# Patient Record
Sex: Female | Born: 1985 | Race: White | Hispanic: No | Marital: Married | Smoking: Never smoker
Health system: Southern US, Community
[De-identification: ages and names within clinical notes are randomized; demographics above are authoritative.]

## PROBLEM LIST (undated history)

## (undated) DIAGNOSIS — O009 Unspecified ectopic pregnancy without intrauterine pregnancy: Secondary | ICD-10-CM

## (undated) HISTORY — PX: TONSILECTOMY/ADENOIDECTOMY WITH MYRINGOTOMY: SHX6125

## (undated) HISTORY — PX: APPENDECTOMY: SHX54

---

## 2012-12-07 ENCOUNTER — Encounter: Payer: Self-pay | Admitting: Emergency Medicine

## 2012-12-07 ENCOUNTER — Ambulatory Visit (INDEPENDENT_AMBULATORY_CARE_PROVIDER_SITE_OTHER): Payer: PRIVATE HEALTH INSURANCE | Admitting: Sports Medicine

## 2012-12-07 ENCOUNTER — Emergency Department
Admission: EM | Admit: 2012-12-07 | Discharge: 2012-12-07 | Disposition: A | Discharge status: Home / Self Care | Payer: PRIVATE HEALTH INSURANCE | Source: Home / Self Care | Attending: Emergency Medicine | Admitting: Emergency Medicine

## 2012-12-07 DIAGNOSIS — M109 Gout, unspecified: Secondary | ICD-10-CM

## 2012-12-07 DIAGNOSIS — M1120 Other chondrocalcinosis, unspecified site: Secondary | ICD-10-CM | POA: Insufficient documentation

## 2012-12-07 HISTORY — DX: Unspecified ectopic pregnancy without intrauterine pregnancy: O00.90

## 2012-12-07 NOTE — ED Notes (Signed)
Reports waking up 2 days ago with swelling and pain in right foot; more intense with weight bearing; no known injury. PATIENT IS [redacted] WEEKS PREGNANT WITH EDC 03-18-2013. Took Tylenol twice today.

## 2012-12-07 NOTE — Assessment & Plan Note (Addendum)
[redacted] weeks pregnant. With initial presentation as podagra, aspirated, and injected. Fluid be sent off for crystal analysis. I like to see her back in one month.  Synovial fluid analysis showed calcium pyrophosphate crystals surprisingly confirming a diagnosis of pseudogout. Plan does not change however she will not be a candidate for uric acid lowering medications.

## 2012-12-07 NOTE — Patient Instructions (Signed)
Gout  Gout is an inflammatory condition (arthritis) caused by a buildup of uric acid crystals in the joints. Uric acid is a chemical that is normally present in the blood. Under some circumstances, uric acid can form into crystals in your joints. This causes joint redness, soreness, and swelling (inflammation). Repeat attacks are common. Over time, uric acid crystals can form into masses (tophi) near a joint, causing disfigurement. Gout is treatable and often preventable.  CAUSES   The disease begins with elevated levels of uric acid in the blood. Uric acid is produced by your body when it breaks down a naturally found substance called purines. This also happens when you eat certain foods such as meats and fish. Causes of an elevated uric acid level include:   Being passed down from parent to child (heredity).   Diseases that cause increased uric acid production (obesity, psoriasis, some cancers).   Excessive alcohol use.   Diet, especially diets rich in meat and seafood.   Medicines, including certain cancer-fighting drugs (chemotherapy), diuretics, and aspirin.   Chronic kidney disease. The kidneys are no longer able to remove uric acid well.   Problems with metabolism.  Conditions strongly associated with gout include:   Obesity.   High blood pressure.   High cholesterol.   Diabetes.  Not everyone with elevated uric acid levels gets gout. It is not understood why some people get gout and others do not. Surgery, joint injury, and eating too much of certain foods are some of the factors that can lead to gout.  SYMPTOMS    An attack of gout comes on quickly. It causes intense pain with redness, swelling, and warmth in a joint.   Fever can occur.   Often, only one joint is involved. Certain joints are more commonly involved:   Base of the big toe.   Knee.   Ankle.   Wrist.   Finger.  Without treatment, an attack usually goes away in a few days to weeks. Between attacks, you usually will not have  symptoms, which is different from many other forms of arthritis.  DIAGNOSIS   Your caregiver will suspect gout based on your symptoms and exam. Removal of fluid from the joint (arthrocentesis) is done to check for uric acid crystals. Your caregiver will give you a medicine that numbs the area (local anesthetic) and use a needle to remove joint fluid for exam. Gout is confirmed when uric acid crystals are seen in joint fluid, using a special microscope. Sometimes, blood, urine, and X-ray tests are also used.  TREATMENT   There are 2 phases to gout treatment: treating the sudden onset (acute) attack and preventing attacks (prophylaxis).  Treatment of an Acute Attack   Medicines are used. These include anti-inflammatory medicines or steroid medicines.   An injection of steroid medicine into the affected joint is sometimes necessary.   The painful joint is rested. Movement can worsen the arthritis.   You may use warm or cold treatments on painful joints, depending which works best for you.   Discuss the use of coffee, vitamin C, or cherries with your caregiver. These may be helpful treatment options.  Treatment to Prevent Attacks  After the acute attack subsides, your caregiver may advise prophylactic medicine. These medicines either help your kidneys eliminate uric acid from your body or decrease your uric acid production. You may need to stay on these medicines for a very long time.  The early phase of treatment with prophylactic medicine can be associated   with an increase in acute gout attacks. For this reason, during the first few months of treatment, your caregiver may also advise you to take medicines usually used for acute gout treatment. Be sure you understand your caregiver's directions.  You should also discuss dietary treatment with your caregiver. Certain foods such as meats and fish can increase uric acid levels. Other foods such as dairy can decrease levels. Your caregiver can give you a list of foods  to avoid.  HOME CARE INSTRUCTIONS    Do not take aspirin to relieve pain. This raises uric acid levels.   Only take over-the-counter or prescription medicines for pain, discomfort, or fever as directed by your caregiver.   Rest the joint as much as possible. When in bed, keep sheets and blankets off painful areas.   Keep the affected joint raised (elevated).   Use crutches if the painful joint is in your leg.   Drink enough water and fluids to keep your urine clear or pale yellow. This helps your body get rid of uric acid. Do not drink alcoholic beverages. They slow the passage of uric acid.   Follow your caregiver's dietary instructions. Pay careful attention to the amount of protein you eat. Your daily diet should emphasize fruits, vegetables, whole grains, and fat-free or low-fat milk products.   Maintain a healthy body weight.  SEEK MEDICAL CARE IF:    You have an oral temperature above 102 F (38.9 C).   You develop diarrhea, vomiting, or any side effects from medicines.   You do not feel better in 24 hours, or you are getting worse.  SEEK IMMEDIATE MEDICAL CARE IF:    Your joint becomes suddenly more tender and you have:   Chills.   An oral temperature above 102 F (38.9 C), not controlled by medicine.  MAKE SURE YOU:    Understand these instructions.   Will watch your condition.   Will get help right away if you are not doing well or get worse.  Document Released: 02/06/2000 Document Revised: 05/03/2011 Document Reviewed: 05/19/2009  ExitCare Patient Information 2014 [employer].

## 2012-12-07 NOTE — Progress Notes (Signed)
   Subjective:    I'm seeing this patient as a consultation for:  Dr. 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 Unfortunately over the past day she noted increasing pain, swelling, and redness of her right first metatarsophalangeal joint. She has no history or family history of gout, denies any constitutional symptoms. Pain is localized, severe, persistent. No radiation.  Past medical history, Surgical history, Family history not pertinant except as noted below, Social history, Allergies, and medications have been entered into the medical record, reviewed, and no changes needed.   Review of Systems: No headache, visual changes, nausea, vomiting, diarrhea, constipation, dizziness, abdominal pain, skin rash, fevers, chills, night sweats, weight loss, swollen lymph nodes, body aches, joint swelling, muscle aches, chest pain, shortness of breath, mood changes, visual or auditory hallucinations.   Objective:   General: Well Developed, well nourished, and in no acute distress.  Neuro/Psych: Alert and oriented x3, extra-ocular muscles intact, able to move all 4 extremities, sensation grossly intact. Skin: Warm and dry, no rashes noted.  Respiratory: Not using accessory muscles, speaking in full sentences, trachea midline.  Cardiovascular: Pulses palpable, no extremity edema. Abdomen: Does not appear distended. Right foot: There is swelling, erythema, and severe tenderness to palpation over the first metatarsophalangeal joint. There is a palpable fluid wave.  Procedure: Real-time Ultrasound Guided aspiration/injection of right first metatarsophalangeal joint Device: GE Logiq E  Verbal informed consent obtained.  Time-out conducted.  Noted no overlying erythema, induration, or other signs of local infection.  Skin prepped in a sterile fashion.  Local anesthesia: Topical Ethyl  chloride.  With sterile technique and under real time ultrasound guidance: 25-gauge needle advanced into the joint, a large effusion was seen, approximately 0.2 cc of cloudy fluid was aspirated, syringe switched, and 1 cc lidocaine, 0.5 cc Kenalog 40 injected easily into the joint.   Completed without difficulty  Pain immediately resolved suggesting accurate placement of the medication.  Advised to call if fevers/chills, erythema, induration, drainage, or persistent bleeding.  Images permanently stored and available for review in the ultrasound unit.  Impression: Technically successful ultrasound guided injection.  Impression and Recommendations:   This case required medical decision making of moderate complexity.

## 2012-12-07 NOTE — ED Provider Notes (Signed)
CSN: [id number]     Arrival date & time 12/07/12  1545 History   First MD Initiated Contact with Patient 12/07/12 1615     Chief Complaint  Patient presents with  . Foot Pain   (Consider location/radiation/quality/duration/timing/severity/associated sxs/prior Treatment) HPI Right foot pain for the last 48 hours.  No known injury.  Patient is [redacted] weeks pregnant.  She is an ICU nurse.  She took Tylenol twice today.  Her pain is rated as moderate to severe located in her right great toe.  No other locations in her foot are painful.  Patient with some swelling and redness around that area.  No history of gout.  Past Medical History  Diagnosis Date  . Ectopic pregnancy     prior pregnancy   Past Surgical History  Procedure Laterality Date  . Appendectomy     Family History  Problem Relation Age of Onset  . Diabetes Father    History  Substance Use Topics  . Smoking status: Never Smoker   . Smokeless tobacco: Not on file  . Alcohol Use: No   OB History   Grav Para Term Preterm Abortions TAB SAB Ect Mult Living   1              Review of Systems  All other systems reviewed and are negative.    Allergies  Review of patient's allergies indicates no known allergies.  Home Medications   Current Outpatient Rx  Name  Route  Sig  Dispense  Refill  . Prenatal Vit-Fe Fumarate-FA (MULTIVITAMIN-PRENATAL) 27-0.8 MG TABS tablet   Oral   Take 1 tablet by mouth daily at 12 noon.          BP 100/70  Pulse 92  Temp(Src) 98.1 F (36.7 C) (Oral)  Resp 16  Ht 5\' 2"  (1.575 m)  Wt 161 lb (73.029 kg)  BMI 29.44 kg/m2  SpO2 99% Physical Exam  Nursing note and vitals reviewed. Constitutional: She is oriented to person, place, and time. She appears well-developed and well-nourished. She appears distressed (while walking).  HENT:  Head: Normocephalic and atraumatic.  Eyes: No scleral icterus.  Neck: Neck supple.  Cardiovascular: Regular rhythm and normal heart sounds.    Pulmonary/Chest: Effort normal and breath sounds normal. No respiratory distress.  Musculoskeletal:  Right great toe examination demonstrates tenderness to palpation at the MTP with a small effusion.  Minimal erythema and some warmth as well.  Range of motion limited by pain.  Antalgic gait.  Distal neurovascular status is intact.  Neurological: She is alert and oriented to person, place, and time.  Skin: Skin is warm and dry.  Psychiatric: She has a normal mood and affect. Her speech is normal.    ED Course  Procedures (including critical care time) Labs Review Labs Reviewed - No data to display Imaging Review No results found.  EKG Interpretation     Ventricular Rate:    PR Interval:    QRS Duration:   QT Interval:    QTC Calculation:   R Axis:     Text Interpretation:              MDM   1. Gout    Due to pregnancy status, I would like to avoid x-rays and systemic medicine, so I have consulted Dr. Karie Schwalbe from next door to come over with his ultrasound machine and do an ultrasound with possible guidance of steroid injection into the MTP joint.  Probably will check uric acid eventually.  Marlaine Hind, MD 12/07/12 [phone]

## 2012-12-08 ENCOUNTER — Ambulatory Visit: Payer: Self-pay | Admitting: Family Medicine

## 2012-12-08 LAB — SYNOVIAL CELL COUNT + DIFF, W/ CRYSTALS
Eosinophils-Synovial: 0 % (ref 0–1)
Lymphocytes-Synovial Fld: 5 % (ref 0–20)
Monocyte/Macrophage: 3 % — ABNORMAL LOW (ref 50–90)
Neutrophil, Synovial: 92 % — ABNORMAL HIGH (ref 0–25)

## 2013-10-25 ENCOUNTER — Encounter: Payer: Self-pay | Admitting: Sports Medicine

## 2013-10-25 ENCOUNTER — Ambulatory Visit (INDEPENDENT_AMBULATORY_CARE_PROVIDER_SITE_OTHER): Payer: PRIVATE HEALTH INSURANCE

## 2013-10-25 ENCOUNTER — Ambulatory Visit (INDEPENDENT_AMBULATORY_CARE_PROVIDER_SITE_OTHER): Payer: PRIVATE HEALTH INSURANCE | Admitting: Sports Medicine

## 2013-10-25 VITALS — BP 113/73 | HR 73 | Ht 62.0 in | Wt 157.0 lb

## 2013-10-25 DIAGNOSIS — M79609 Pain in unspecified limb: Secondary | ICD-10-CM

## 2013-10-25 DIAGNOSIS — M1120 Other chondrocalcinosis, unspecified site: Secondary | ICD-10-CM

## 2013-10-25 LAB — CBC WITH DIFFERENTIAL/PLATELET
Basophils Absolute: 0.1 10*3/uL (ref 0.0–0.1)
Basophils Relative: 1 % (ref 0–1)
Eosinophils Absolute: 0.3 K/uL (ref 0.0–0.7)
Eosinophils Relative: 4 % (ref 0–5)
HCT: 41.3 % (ref 36.0–46.0)
Hemoglobin: 13.7 g/dL (ref 12.0–15.0)
Lymphocytes Relative: 26 % (ref 12–46)
Lymphs Abs: 1.9 K/uL (ref 0.7–4.0)
MCH: 28.1 pg (ref 26.0–34.0)
MCHC: 33.2 g/dL (ref 30.0–36.0)
MCV: 84.6 fL (ref 78.0–100.0)
Monocytes Absolute: 0.7 10*3/uL (ref 0.1–1.0)
Monocytes Relative: 9 % (ref 3–12)
Neutro Abs: 4.4 10*3/uL (ref 1.7–7.7)
Neutrophils Relative %: 60 % (ref 43–77)
Platelets: 258 K/uL (ref 150–400)
RBC: 4.88 MIL/uL (ref 3.87–5.11)
RDW: 14.3 % (ref 11.5–15.5)
WBC: 7.4 10*3/uL (ref 4.0–10.5)

## 2013-10-25 LAB — COMPREHENSIVE METABOLIC PANEL WITH GFR
BUN: 15 mg/dL (ref 6–23)
CO2: 25 meq/L (ref 19–32)
Calcium: 9.2 mg/dL (ref 8.4–10.5)
Chloride: 102 meq/L (ref 96–112)
Total Bilirubin: 0.7 mg/dL (ref 0.2–1.2)

## 2013-10-25 LAB — COMPREHENSIVE METABOLIC PANEL
ALT: 9 U/L (ref 0–35)
AST: 18 U/L (ref 0–37)
Albumin: 4.4 g/dL (ref 3.5–5.2)
Alkaline Phosphatase: 99 U/L (ref 39–117)
Creat: 0.68 mg/dL (ref 0.50–1.10)
Glucose, Bld: 77 mg/dL (ref 70–99)
Potassium: 4.2 mEq/L (ref 3.5–5.3)
Sodium: 139 mEq/L (ref 135–145)
Total Protein: 7 g/dL (ref 6.0–8.3)

## 2013-10-25 LAB — URIC ACID: Uric Acid, Serum: 4.4 mg/dL (ref 2.4–7.0)

## 2013-10-25 LAB — RHEUMATOID FACTOR: Rheumatoid fact SerPl-aCnc: <10 [IU]/mL (ref <=14)

## 2013-10-25 MED ORDER — DICLOFENAC SODIUM 75 MG PO TBEC: 75.0000 mg | DELAYED_RELEASE_TABLET | Freq: Two times a day (BID) | ORAL | Status: DC

## 2013-10-25 NOTE — Progress Notes (Signed)
Patient ID: Mercedes Miller, female   DOB: [birth year], 28 y.o.   MRN: [id number]  Subjective:    CC: Right first toe pain, AM foot pain  HPI: Mercedes Miller is a very pleasant 28 year old female with history of pseudogout who presents with two days of pain in the right first MTP joint. Pseudogout diagnosis was made in our office in October 2014 when she presented with similar pain of the same joint; she has had no other flares over the past year. Subsequently responded very well to joint aspiration and injection. Has tried ibuprofen for the pain over the past two days which has not provided relief. Is currently breastfeeding.  Mercedes Miller also reports several months of diffuse pain in her feet when first getting out of bed in the morning which lessens after roughly one hour on her feet. She has a significant family history of both rheumatoid arthritis and osteoarthritis. 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  Past medical history, Surgical history, Family history not pertinant except as noted below, Social history, Allergies, and medications have been entered into the medical record, reviewed, and no changes needed.   Review of Systems: No fevers, chills, night sweats, weight loss, chest pain, or shortness of breath.   Objective:    General: Well developed, well nourished, and in no acute distress.  Neuro: Alert and oriented x3, extra-ocular muscles intact, sensation grossly intact.  HEENT: Normocephalic, atraumatic, pupils equal round reactive to light, neck supple, no masses, no lymphadenopathy, thyroid nonpalpable.  Skin: Warm and dry, no rashes. Cardiac: Regular rate and rhythm, no murmurs rubs or gallops, no lower extremity edema.  Respiratory: Clear to auscultation bilaterally. Not using accessory muscles, speaking in full sentences.  Right Foot: No visible erythema or swelling. Range of motion is full in all directions. Strength is 5/5 in all  directions. No hallux valgus. No pes cavus or pes planus. No abnormal callus noted. No pain over the navicular prominence, or base of fifth metatarsal. No tenderness to palpation of the calcaneal insertion of plantar fascia. No pain at the Achilles insertion. No pain over the calcaneal bursa. No pain of the retrocalcaneal bursa. Tenderness to palpation over the first metatarsophalangeal joint. No tenderness to palpation over the other tarsals, metatarsals, or phalanges. No hallux rigidus or limitus. No tenderness palpation over interphalangeal joints. Pain with compression of the first metatarsal head. No pain with compression of the other metatarsal heads. Neurovascularly intact distally.  Procedure:  Injection of right first metatarsophalangeal joint Consent obtained and verified. Time-out conducted. Noted no overlying erythema, induration, or other signs of local infection. Skin prepped in a sterile fashion. Topical analgesic spray: Ethyl chloride. Completed without difficulty. Meds: 0.5 cc Kenalog 40, 0.5 cc lidocaine injected easily into the first metatarsophalangeal joint where I did see some synovitis. There was no effusion. Pain immediately improved suggesting accurate placement of the medication. Advised to call if fevers/chills, erythema, induration, drainage, or persistent bleeding.  Impression and Recommendations:    1. Toe Pain: Pseudogout flare is the most likely diagnosis in this patient with pain in the right first MTP and history of pseudogout. Colchicine is not advisable at this time, as she is having these flares infrequently and is currently breastfeeding her 88-monthold son; however, she could be a candidate in the future. - Injection today  2. Morning Foot Pain: Osteoarthritis and rheumatoid arthritis are on the differential diagnosis in this patient with morning foot pain and a  significant family history. Lupus is also a possibility in this young, female  patient. - Diclofenac PRN for pain - Return for custom orthotics - CBC with Differential, RF, CCP, Uric Acid, ANA, ESR, CMP

## 2013-10-25 NOTE — Assessment & Plan Note (Signed)
Excellent response to aspiration and injection last year. Repeat injection today. Also rheumatoid workup, x-rays. Adding diclofenac as needed for pain. Return for custom orthotics.

## 2013-10-26 LAB — CYCLIC CITRUL PEPTIDE ANTIBODY, IGG: Cyclic Citrullin Peptide Ab: <2 U/mL (ref 0.0–5.0)

## 2013-10-26 LAB — ANA: Anti Nuclear Antibody(ANA): NEGATIVE

## 2013-10-26 LAB — SEDIMENTATION RATE: Sed Rate: 4 mm/hr (ref 0–22)

## 2013-11-05 ENCOUNTER — Encounter: Payer: Self-pay | Admitting: Sports Medicine

## 2013-11-05 ENCOUNTER — Ambulatory Visit (INDEPENDENT_AMBULATORY_CARE_PROVIDER_SITE_OTHER): Payer: PRIVATE HEALTH INSURANCE | Admitting: Sports Medicine

## 2013-11-05 VITALS — BP 103/69 | HR 59 | Ht 62.0 in | Wt 154.0 lb

## 2013-11-05 DIAGNOSIS — M1120 Other chondrocalcinosis, unspecified site: Secondary | ICD-10-CM

## 2013-11-05 NOTE — Assessment & Plan Note (Signed)
Custom orthotics as above. All labs were normal. Return as needed.

## 2013-11-05 NOTE — Progress Notes (Signed)

## 2013-11-14 ENCOUNTER — Ambulatory Visit: Payer: PRIVATE HEALTH INSURANCE | Admitting: Sports Medicine

## 2013-11-22 ENCOUNTER — Ambulatory Visit (INDEPENDENT_AMBULATORY_CARE_PROVIDER_SITE_OTHER): Payer: PRIVATE HEALTH INSURANCE | Admitting: Sports Medicine

## 2013-11-22 ENCOUNTER — Encounter: Payer: Self-pay | Admitting: Sports Medicine

## 2013-11-22 VITALS — BP 113/68 | HR 73 | Ht 62.0 in | Wt 153.0 lb

## 2013-11-22 DIAGNOSIS — Z0000 Encounter for general adult medical examination without abnormal findings: Secondary | ICD-10-CM | POA: Insufficient documentation

## 2013-11-22 NOTE — Assessment & Plan Note (Signed)
Healthy female. Checking some screening blood work. She will be getting her flu shot through work, up-to-date on TDap Return in one year.

## 2013-11-22 NOTE — Progress Notes (Signed)
  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  Past medical history, Surgical history, Family history not pertinant except as noted below, Social history, Allergies, and medications have been entered into the medical record, reviewed, and no changes needed.   Review of Systems: No fevers, chills, night sweats, weight loss, chest pain, or shortness of breath.   Objective:    General: Well Developed, well nourished, and in no acute distress.  Neuro: Alert and oriented x3, extra-ocular muscles intact, sensation grossly intact.  HEENT: Normocephalic, atraumatic, pupils equal round reactive to light, neck supple, no masses, no lymphadenopathy, thyroid nonpalpable.  Skin: Warm and dry, no rashes. Cardiac: Regular rate and rhythm, no murmurs rubs or gallops, no lower extremity edema.  Respiratory: Clear to auscultation bilaterally. Not using accessory muscles, speaking in full sentences.  Impression and Recommendations:

## 2013-12-24 ENCOUNTER — Encounter: Payer: Self-pay | Admitting: Sports Medicine

## 2014-02-19 ENCOUNTER — Ambulatory Visit: Payer: PRIVATE HEALTH INSURANCE | Admitting: Sports Medicine

## 2014-02-22 HISTORY — PX: CHOLECYSTECTOMY: SHX55

## 2014-05-01 ENCOUNTER — Encounter: Payer: Self-pay | Admitting: Sports Medicine

## 2014-05-01 ENCOUNTER — Ambulatory Visit (INDEPENDENT_AMBULATORY_CARE_PROVIDER_SITE_OTHER): Payer: PRIVATE HEALTH INSURANCE | Admitting: Sports Medicine

## 2014-05-01 VITALS — BP 119/74 | HR 82 | Ht 62.0 in | Wt 157.0 lb

## 2014-05-01 DIAGNOSIS — J0100 Acute maxillary sinusitis, unspecified: Secondary | ICD-10-CM

## 2014-05-01 DIAGNOSIS — Z331 Pregnant state, incidental: Secondary | ICD-10-CM

## 2014-05-01 DIAGNOSIS — K8050 Calculus of bile duct without cholangitis or cholecystitis without obstruction: Secondary | ICD-10-CM

## 2014-05-01 DIAGNOSIS — Z9049 Acquired absence of other specified parts of digestive tract: Secondary | ICD-10-CM | POA: Insufficient documentation

## 2014-05-01 DIAGNOSIS — Z3490 Encounter for supervision of normal pregnancy, unspecified, unspecified trimester: Secondary | ICD-10-CM | POA: Insufficient documentation

## 2014-05-01 MED ORDER — AMOXICILLIN-POT CLAVULANATE 875-125 MG PO TABS: 1.0000 | ORAL_TABLET | Freq: Two times a day (BID) | ORAL | Status: DC

## 2014-05-01 MED ORDER — PROMETHAZINE HCL 25 MG PO TABS: 25.0000 mg | ORAL_TABLET | Freq: Four times a day (QID) | ORAL | Status: DC | PRN

## 2014-05-01 MED ORDER — HYDROCOD POLST-CHLORPHEN POLST 10-8 MG/5ML PO LQCR: 5.0000 mL | Freq: Two times a day (BID) | ORAL | Status: DC | PRN

## 2014-05-01 MED ORDER — FLUTICASONE PROPIONATE 50 MCG/ACT NA SUSP: NASAL | Status: DC

## 2014-05-01 MED ORDER — URSODIOL 300 MG PO CAPS: 300.0000 mg | ORAL_CAPSULE | Freq: Two times a day (BID) | ORAL | Status: DC

## 2014-05-01 NOTE — Assessment & Plan Note (Signed)
Gallbladder sludge with right upper quadrant pain and biliary colic. We are going to add Actigall which is safe during pregnancy. She will follow-up with her surgeon regarding this.

## 2014-05-01 NOTE — Assessment & Plan Note (Addendum)
Currently 7 weeks. With moderate nausea, multiple studies demonstrated safety in pregnancy, so we will use promethazine.

## 2014-05-01 NOTE — Progress Notes (Signed)
  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 Symptoms are moderate, persistent.   Pregnant: She is now [redacted] weeks pregnant, doing well.  Biliary colic: Noted some right upper quadrant pain, ultrasound showed biliary sludge, she was set up for cholecystectomy however at the same time was noted to be pregnant. Wonders if anything can be done in the meantime. She has noted some steatorrhea. No fevers, chills, or abdominal pain.   Past medical history, Surgical history, Family history not pertinant except as noted below, Social history, Allergies, and medications have been entered into the medical record, reviewed, and no changes needed.   Review of Systems: No fevers, chills, night sweats, weight loss, chest pain, or shortness of breath.   Objective:    General: Well Developed, well nourished, and in no acute distress.  Neuro: Alert and oriented x3, extra-ocular muscles intact, sensation grossly intact.  HEENT: Normocephalic, atraumatic, pupils equal round reactive to light, neck supple, no masses, no lymphadenopathy, thyroid nonpalpable.  nasopharynx, ear canals unremarkable, oropharynx shows minimal erythema, without tonsillitis or tonsillar exudates.  Skin: Warm and dry, no rashes. Cardiac: Regular rate and rhythm, no murmurs rubs or gallops, no lower extremity edema.  Respiratory: Clear to auscultation bilaterally. Not using accessory muscles, speaking in full sentences. Abdomen: Soft, nontender, nondistended, normal bowel sounds, no palpable masses, no guarding or rigidity.   Impression and Recommendations:

## 2014-05-01 NOTE — Assessment & Plan Note (Signed)
Augmentin, Flonase, Tussionex.

## 2015-04-25 ENCOUNTER — Encounter: Payer: Self-pay | Admitting: Sports Medicine

## 2015-04-25 ENCOUNTER — Ambulatory Visit (INDEPENDENT_AMBULATORY_CARE_PROVIDER_SITE_OTHER): Payer: PRIVATE HEALTH INSURANCE | Admitting: Sports Medicine

## 2015-04-25 VITALS — BP 111/71 | HR 82 | Wt 165.0 lb

## 2015-04-25 DIAGNOSIS — E785 Hyperlipidemia, unspecified: Secondary | ICD-10-CM | POA: Diagnosis not present

## 2015-04-25 DIAGNOSIS — K8050 Calculus of bile duct without cholangitis or cholecystitis without obstruction: Secondary | ICD-10-CM

## 2015-04-25 DIAGNOSIS — Z0000 Encounter for general adult medical examination without abnormal findings: Secondary | ICD-10-CM | POA: Diagnosis not present

## 2015-04-25 DIAGNOSIS — N281 Cyst of kidney, acquired: Secondary | ICD-10-CM | POA: Insufficient documentation

## 2015-04-25 DIAGNOSIS — Q6479 Other congenital malformations of bladder and urethra: Secondary | ICD-10-CM

## 2015-04-25 LAB — CBC
HCT: 40.4 % (ref 36.0–46.0)
Hemoglobin: 13.3 g/dL (ref 12.0–15.0)
MCH: 28.5 pg (ref 26.0–34.0)
MCHC: 32.9 g/dL (ref 30.0–36.0)
MCV: 86.5 fL (ref 78.0–100.0)
MPV: 9.9 fL (ref 8.6–12.4)
Platelets: 267 10*3/uL (ref 150–400)
RBC: 4.67 MIL/uL (ref 3.87–5.11)
RDW: 13.6 % (ref 11.5–15.5)
WBC: 8.6 10*3/uL (ref 4.0–10.5)

## 2015-04-25 NOTE — Assessment & Plan Note (Signed)
Patient will go-ahead and scheduled for cholecystectomy, abdominal ultrasound did show echogenic bladder debris which needs further evaluation.

## 2015-04-25 NOTE — Assessment & Plan Note (Signed)
Checking routine bloodwork. 

## 2015-04-25 NOTE — Progress Notes (Signed)
  Subjective:    CC: follow-up  HPI: Biliary colic: Persistent, was scheduled for cholecystectomy but got pregnant. Having a persistence of symptoms.  Preventive measure: Up-to-date on cervical cancer screening, needs blood work.  Bladder abnormalities: On a abdominal ultrasound from 2015 ordered by another provider there was noted to be echogenic debris in the bladder, no further follow-up was done. Patient is asymptomatic from her renal and a urinary standpoint.  Past medical history, Surgical history, Family history not pertinant except as noted below, Social history, Allergies, and medications have been entered into the medical record, reviewed, and no changes needed.   Review of Systems: No fevers, chills, night sweats, weight loss, chest pain, or shortness of breath.   Objective:    General: Well Developed, well nourished, and in no acute distress.  Neuro: Alert and oriented x3, extra-ocular muscles intact, sensation grossly intact.  HEENT: Normocephalic, atraumatic, pupils equal round reactive to light, neck supple, no masses, no lymphadenopathy, thyroid nonpalpable.  Skin: Warm and dry, no rashes. Cardiac: Regular rate and rhythm, no murmurs rubs or gallops, no lower extremity edema.  Respiratory: Clear to auscultation bilaterally. Not using accessory muscles, speaking in full sentences. Abdomen: Soft, minimally tender in the right upper quadrant, negative Murphy sign, nondistended, normal bowel sounds, no guarding, no rigidity  Impression and Recommendations:

## 2015-04-25 NOTE — Assessment & Plan Note (Signed)
Checking renal ultrasound. Urinalysis.

## 2015-04-26 LAB — COMPREHENSIVE METABOLIC PANEL
Albumin: 4.3 g/dL (ref 3.6–5.1)
BUN: 14 mg/dL (ref 7–25)
CO2: 20 mmol/L (ref 20–31)
Chloride: 106 mmol/L (ref 98–110)
Creat: 0.69 mg/dL (ref 0.50–1.10)
Glucose, Bld: 88 mg/dL (ref 65–99)
Sodium: 139 mmol/L (ref 135–146)

## 2015-04-26 LAB — COMPREHENSIVE METABOLIC PANEL WITH GFR
ALT: 12 U/L (ref 6–29)
AST: 19 U/L (ref 10–30)
Alkaline Phosphatase: 89 U/L (ref 33–115)
Calcium: 9.5 mg/dL (ref 8.6–10.2)
Potassium: 4.1 mmol/L (ref 3.5–5.3)
Total Bilirubin: 0.4 mg/dL (ref 0.2–1.2)
Total Protein: 7 g/dL (ref 6.1–8.1)

## 2015-04-26 LAB — URINALYSIS
Bilirubin Urine: NEGATIVE
Glucose, UA: NEGATIVE
Hgb urine dipstick: NEGATIVE
Ketones, ur: NEGATIVE
Leukocytes, UA: NEGATIVE
Nitrite: NEGATIVE
Protein, ur: NEGATIVE
Specific Gravity, Urine: 1.024 (ref 1.001–1.035)
pH: 6 (ref 5.0–8.0)

## 2015-04-26 LAB — LIPID PANEL
Cholesterol: 227 mg/dL — ABNORMAL HIGH (ref 125–200)
HDL: 78 mg/dL (ref >=46)
LDL Cholesterol: 136 mg/dL — ABNORMAL HIGH (ref <130)
Total CHOL/HDL Ratio: 2.9 Ratio (ref <=5.0)
Triglycerides: 63 mg/dL (ref <150)
VLDL: 13 mg/dL (ref <30)

## 2015-04-26 LAB — HEMOGLOBIN A1C
Hgb A1c MFr Bld: 5 % (ref <5.7)
Mean Plasma Glucose: 97 mg/dL (ref <117)

## 2015-04-26 LAB — VITAMIN D 25 HYDROXY (VIT D DEFICIENCY, FRACTURES): Vit D, 25-Hydroxy: 15 ng/mL — ABNORMAL LOW (ref 30–100)

## 2015-04-26 LAB — TSH: TSH: 1.34 mIU/L

## 2015-04-28 DIAGNOSIS — E785 Hyperlipidemia, unspecified: Secondary | ICD-10-CM | POA: Insufficient documentation

## 2015-04-28 MED ORDER — VITAMIN D (ERGOCALCIFEROL) 1.25 MG (50000 UNIT) PO CAPS: 50000.0000 [IU] | ORAL_CAPSULE | ORAL | Status: DC

## 2015-04-28 NOTE — Addendum Note (Signed)
Addended by: Monica BectonHEKKEKANDAM, Bernell Haynie J on: 04/28/2015 12:03 PM   Modules accepted: Orders

## 2015-04-29 ENCOUNTER — Ambulatory Visit (INDEPENDENT_AMBULATORY_CARE_PROVIDER_SITE_OTHER): Payer: PRIVATE HEALTH INSURANCE

## 2015-04-29 DIAGNOSIS — N281 Cyst of kidney, acquired: Secondary | ICD-10-CM

## 2015-04-29 DIAGNOSIS — Q6479 Other congenital malformations of bladder and urethra: Secondary | ICD-10-CM

## 2015-05-05 ENCOUNTER — Ambulatory Visit: Payer: PRIVATE HEALTH INSURANCE

## 2015-05-05 ENCOUNTER — Ambulatory Visit (INDEPENDENT_AMBULATORY_CARE_PROVIDER_SITE_OTHER): Payer: PRIVATE HEALTH INSURANCE | Admitting: Sports Medicine

## 2015-05-05 VITALS — BP 126/83 | HR 78

## 2015-05-05 DIAGNOSIS — N926 Irregular menstruation, unspecified: Secondary | ICD-10-CM | POA: Diagnosis not present

## 2015-05-05 DIAGNOSIS — Q6479 Other congenital malformations of bladder and urethra: Secondary | ICD-10-CM

## 2015-05-05 LAB — POCT URINE PREGNANCY: Preg Test, Ur: NEGATIVE

## 2015-05-05 MED ORDER — GADOBENATE DIMEGLUMINE 529 MG/ML IV SOLN
15.0000 mL | Freq: Once | INTRAVENOUS | Status: AC | PRN
  Administered 2015-05-05: 15 mL via INTRAVENOUS

## 2015-05-05 NOTE — Progress Notes (Signed)
Patient came into clinic today for a urine pregnancy test prior to MRI imaging. Pt reports she has not had her period since giving birth to her second child, but she is still currently breast feeding. Pt was able to provide a urine sample in clinic, and POCT urine pregnancy test was negative. Imaging advised of negative results. No further questions.

## 2015-06-13 ENCOUNTER — Encounter: Payer: Self-pay | Admitting: Physician Assistant

## 2015-06-13 ENCOUNTER — Ambulatory Visit (INDEPENDENT_AMBULATORY_CARE_PROVIDER_SITE_OTHER): Payer: PRIVATE HEALTH INSURANCE | Admitting: Physician Assistant

## 2015-06-13 VITALS — BP 126/88 | HR 89 | Ht 62.0 in | Wt 154.0 lb

## 2015-06-13 DIAGNOSIS — K828 Other specified diseases of gallbladder: Secondary | ICD-10-CM | POA: Diagnosis not present

## 2015-06-13 DIAGNOSIS — R1011 Right upper quadrant pain: Secondary | ICD-10-CM | POA: Diagnosis not present

## 2015-06-13 MED ORDER — ONDANSETRON 8 MG PO TBDP: 8.0000 mg | ORAL_TABLET | Freq: Three times a day (TID) | ORAL | Status: DC | PRN

## 2015-06-13 MED ORDER — HYDROCODONE-ACETAMINOPHEN 5-325 MG PO TABS: 1.0000 | ORAL_TABLET | Freq: Three times a day (TID) | ORAL | Status: DC | PRN

## 2015-06-13 NOTE — Progress Notes (Signed)
   Subjective:    Patient ID: Mercedes Miller, female    DOB: 1985-07-03, 30 y.o.   MRN: [id number]  HPI Patient is here today complaining of RUQ pain. Patient was seen in ED in 01/2014 and found to have sludge in her gallbladder. [facility] was [facility] with Hay[facility] Hoehn[facility] Powell, but had to cancel because she became pregnant. She has been managing her symptoms with diet, but is no longer able to do so. She is experiencing nausea, vomiting, and worsening RUQ pain 30 minutes after meals. Pain radiates to right upper back and shoulder. She rates the pain a 7 out of 10.    Review of Systems     Objective:   Physical Exam  Constitutional: She appears well-developed and well-nourished.  HENT:  Head: Normocephalic and atraumatic.  Cardiovascular: Normal rate, regular rhythm and normal heart sounds.   Pulmonary/Chest: Effort normal and breath sounds normal.  Abdominal: Soft. Bowel sounds are normal. She exhibits no mass. There is tenderness (RUQ). There is guarding (RUQ).  Positive Murphy's sign.    Skin: Skin is warm and dry.          Assessment & Plan:  1. Gall bladder sludge/RUQ pain- Patient was diagnosed with gall bladder sludge in 01/2014 after having imaging at ED. She is complaining of increasing worse RUQ pain, right back and right shoulder pain, nausea, and vomiting, and weight loss. Will refer to Haynes HoehnMyron Powell for Cholecystectomy.

## 2015-06-14 LAB — CBC WITH DIFFERENTIAL/PLATELET
Basophils Absolute: 117 cells/uL (ref 0–200)
Basophils Relative: 1 %
EOS ABS: 468 {cells}/uL (ref 15–500)
Eosinophils Relative: 4 %
HCT: 43.2 % (ref 35.0–45.0)
HEMOGLOBIN: 14.4 g/dL (ref 11.7–15.5)
LYMPHS ABS: 2574 {cells}/uL (ref 850–3900)
Lymphocytes Relative: 22 %
MCH: 28.9 pg (ref 27.0–33.0)
MCHC: 33.3 g/dL (ref 32.0–36.0)
MCV: 86.7 fL (ref 80.0–100.0)
MONOS PCT: 8 %
MPV: 11.2 fL (ref 7.5–12.5)
Monocytes Absolute: 936 cells/uL (ref 200–950)
NEUTROS ABS: 7605 {cells}/uL (ref 1500–7800)
Neutrophils Relative %: 65 %
Platelets: 282 10*3/uL (ref 140–400)
RBC: 4.98 MIL/uL (ref 3.80–5.10)
RDW: 12.7 % (ref 11.0–15.0)
WBC: 11.7 10*3/uL — ABNORMAL HIGH (ref 3.8–10.8)

## 2015-06-14 LAB — COMPLETE METABOLIC PANEL WITH GFR
ALT: 10 U/L (ref 6–29)
AST: 17 U/L (ref 10–30)
Albumin: 4.6 g/dL (ref 3.6–5.1)
Alkaline Phosphatase: 90 U/L (ref 33–115)
BUN: 15 mg/dL (ref 7–25)
CHLORIDE: 105 mmol/L (ref 98–110)
CO2: 19 mmol/L — AB (ref 20–31)
Calcium: 9.6 mg/dL (ref 8.6–10.2)
Creat: 0.78 mg/dL (ref 0.50–1.10)
GFR, Est Non African American: >89 mL/min (ref >=60)
Glucose, Bld: 83 mg/dL (ref 65–99)
POTASSIUM: 4 mmol/L (ref 3.5–5.3)
Sodium: 139 mmol/L (ref 135–146)
Total Bilirubin: 0.5 mg/dL (ref 0.2–1.2)
Total Protein: 7.2 g/dL (ref 6.1–8.1)

## 2015-06-16 NOTE — Addendum Note (Signed)
Addended by: Donne AnonBENDER, Aspasia Rude L on: 06/16/2015 11:11 AM   Modules accepted: Orders

## 2015-06-23 ENCOUNTER — Telehealth: Payer: Self-pay

## 2015-06-23 MED ORDER — HYDROCODONE-ACETAMINOPHEN 5-325 MG PO TABS: 1.0000 | ORAL_TABLET | Freq: Three times a day (TID) | ORAL | Status: DC | PRN

## 2015-06-23 NOTE — Telephone Encounter (Signed)
Ok for 25 use only as needed and try to keep to no more than twice a day. Use stool softener to keep from constipation.

## 2015-06-23 NOTE — Telephone Encounter (Signed)
Mercedes Miller called and states her gallbladder surgery is not until 07/01/2015. She would like a refill of the pain medication to last until then. Please advise.

## 2015-06-23 NOTE — Telephone Encounter (Signed)
Patient advised and will pick up this afternoon.

## 2015-10-15 ENCOUNTER — Other Ambulatory Visit: Payer: PRIVATE HEALTH INSURANCE

## 2015-10-15 ENCOUNTER — Encounter: Payer: Self-pay | Admitting: Sports Medicine

## 2015-10-15 ENCOUNTER — Ambulatory Visit (INDEPENDENT_AMBULATORY_CARE_PROVIDER_SITE_OTHER): Payer: PRIVATE HEALTH INSURANCE | Admitting: Sports Medicine

## 2015-10-15 DIAGNOSIS — J0100 Acute maxillary sinusitis, unspecified: Secondary | ICD-10-CM | POA: Diagnosis not present

## 2015-10-15 DIAGNOSIS — Z9049 Acquired absence of other specified parts of digestive tract: Secondary | ICD-10-CM | POA: Diagnosis not present

## 2015-10-15 MED ORDER — AMOXICILLIN-POT CLAVULANATE 875-125 MG PO TABS: 1.0000 | ORAL_TABLET | Freq: Two times a day (BID) | ORAL | 0 refills | Status: AC

## 2015-10-15 MED ORDER — HYDROCODONE-ACETAMINOPHEN 5-325 MG PO TABS: 0.5000 | ORAL_TABLET | Freq: Three times a day (TID) | ORAL | 0 refills | Status: DC | PRN

## 2015-10-15 MED ORDER — FLUTICASONE PROPIONATE 50 MCG/ACT NA SUSP: NASAL | 3 refills | Status: DC

## 2015-10-15 NOTE — Progress Notes (Signed)
  Subjective:    CC: Coughing  HPI: For 2 weeks this pleasant 30 year old female has had a cough, right-sided maxillary sinus pressure with radiation to the ear, moderate, persistent without nasal discharge.  Abdominal pain: Right upper quadrant, has been constant post cholecystectomy, nothing makes it better or worse.  Past medical history, Surgical history, Family history not pertinant except as noted below, Social history, Allergies, and medications have been entered into the medical record, reviewed, and no changes needed.   Review of Systems: No fevers, chills, night sweats, weight loss, chest pain, or shortness of breath.   Objective:    General: Well Developed, well nourished, and in no acute distress.  Neuro: Alert and oriented x3, extra-ocular muscles intact, sensation grossly intact.  HEENT: Normocephalic, atraumatic, pupils equal round reactive to light, neck supple, no masses, no lymphadenopathy, thyroid nonpalpable. Oropharynx, nasopharynx, ear canals unremarkable, tender to palpation over the right maxillary sinus. Skin: Warm and dry, no rashes. Cardiac: Regular rate and rhythm, no murmurs rubs or gallops, no lower extremity edema.  Respiratory: Clear to auscultation bilaterally. Not using accessory muscles, speaking in full sentences. Abdomen: Soft, tender to palpation in the right upper quadrant, nondistended, normal bowel sounds, no palpable masses, guarding, rigidity, rebound tenderness.  Impression and Recommendations:    Acute non-recurrent maxillary sinusitis Augmentin, patient is nursing, Flonase, low-dose hydrocodone for cough.  Hx of cholecystectomy Persistent right upper quadrant pain under the costal margin. Abdominal ultrasound, if negative may need to proceed with abdominal pelvic CT with oral and IV contrast as well as GI referral for colonoscopy.

## 2015-10-15 NOTE — Assessment & Plan Note (Signed)
Persistent right upper quadrant pain under the costal margin. Abdominal ultrasound, if negative may need to proceed with abdominal pelvic CT with oral and IV contrast as well as GI referral for colonoscopy.

## 2015-10-15 NOTE — Assessment & Plan Note (Signed)
Augmentin, patient is nursing, Flonase, low-dose hydrocodone for cough.

## 2015-10-16 ENCOUNTER — Ambulatory Visit (INDEPENDENT_AMBULATORY_CARE_PROVIDER_SITE_OTHER): Payer: PRIVATE HEALTH INSURANCE

## 2015-10-16 DIAGNOSIS — Z9049 Acquired absence of other specified parts of digestive tract: Secondary | ICD-10-CM | POA: Diagnosis not present

## 2015-11-20 ENCOUNTER — Encounter: Payer: Self-pay | Admitting: Emergency Medicine

## 2015-11-20 ENCOUNTER — Emergency Department
Admission: EM | Admit: 2015-11-20 | Discharge: 2015-11-20 | Disposition: A | Discharge status: Home / Self Care | Payer: PRIVATE HEALTH INSURANCE | Source: Home / Self Care

## 2015-11-20 DIAGNOSIS — R531 Weakness: Secondary | ICD-10-CM

## 2015-11-20 DIAGNOSIS — R42 Dizziness and giddiness: Secondary | ICD-10-CM

## 2015-11-20 NOTE — ED Triage Notes (Signed)
Patient states she has experienced intermittent dizziness and weakness for past few weeks. Labs were done April, 2017 and wnl; she is breast feeding 311 month old infant. She used fathers glucometer with out of date strips before coming tonight to see if BG might be cause; results were 280. BG done during triage= 92. Patient feels like room spinning and was fine with lying down on exam table.

## 2015-11-21 ENCOUNTER — Telehealth: Payer: Self-pay | Admitting: *Deleted

## 2015-11-21 LAB — CBC WITH DIFFERENTIAL/PLATELET
BASOS PCT: 0 %
Basophils Absolute: 0 cells/uL (ref 0–200)
EOS ABS: 129 {cells}/uL (ref 15–500)
EOS PCT: 1 %
HCT: 40.4 % (ref 35.0–45.0)
Hemoglobin: 13.4 g/dL (ref 11.7–15.5)
LYMPHS PCT: 14 %
Lymphs Abs: 1806 cells/uL (ref 850–3900)
MCH: 28.6 pg (ref 27.0–33.0)
MCHC: 33.2 g/dL (ref 32.0–36.0)
MCV: 86.1 fL (ref 80.0–100.0)
MPV: 11 fL (ref 7.5–12.5)
Monocytes Absolute: 774 cells/uL (ref 200–950)
Monocytes Relative: 6 %
Neutro Abs: 10191 cells/uL — ABNORMAL HIGH (ref 1500–7800)
Neutrophils Relative %: 79 %
Platelets: 278 10*3/uL (ref 140–400)
RBC: 4.69 MIL/uL (ref 3.80–5.10)
RDW: 13.3 % (ref 11.0–15.0)
WBC: 12.9 10*3/uL — ABNORMAL HIGH (ref 3.8–10.8)

## 2015-11-21 LAB — COMPREHENSIVE METABOLIC PANEL
ALK PHOS: 78 U/L (ref 33–115)
ALT: 8 U/L (ref 6–29)
AST: 16 U/L (ref 10–30)
Albumin: 4.2 g/dL (ref 3.6–5.1)
BUN: 10 mg/dL (ref 7–25)
CO2: 20 mmol/L (ref 20–31)
CREATININE: 0.67 mg/dL (ref 0.50–1.10)
Calcium: 8.8 mg/dL (ref 8.6–10.2)
Chloride: 108 mmol/L (ref 98–110)
GLUCOSE: 102 mg/dL — AB (ref 65–99)
POTASSIUM: 3.9 mmol/L (ref 3.5–5.3)
SODIUM: 138 mmol/L (ref 135–146)
TOTAL PROTEIN: 6.7 g/dL (ref 6.1–8.1)
Total Bilirubin: 0.4 mg/dL (ref 0.2–1.2)

## 2015-11-21 LAB — TSH: TSH: 0.91 m[IU]/L

## 2015-11-21 NOTE — Telephone Encounter (Signed)
Callback: LMOM All labs WNL with exception of WBC. Per Dr. Cathren HarshBeese, may in come for a nurse visit to be checked for strep. Could be viral. Strongly encouraged followup with PCP Monday. If worsening symptoms or new symptoms be evaluated sooner. Call back as needed.

## 2015-11-21 NOTE — ED Provider Notes (Signed)
[facility]-EMER[facility]ENCY DEPT Provider Note   CSN: [id number] Arrival date & time: 11/20/15  1847     History   Chief Complaint Chief Complaint  Patient presents with  . Dizziness  . Weakness    HPI Mercedes Miller is a [age] y.o. female.  The history is provided by the patient. No language interpreter was used.  Dizziness  Quality:  Head spinning and lightheadedness Severity:  Moderate Onset quality:  Gradual Duration:  2 weeks Timing:  Constant Progression:  Worsening Chronicity:  New Context: not when urinating   Relieved by:  Nothing Worsened by:  Nothing Ineffective treatments:  None tried Associated symptoms: weakness   Associated symptoms: no chest pain   Risk factors: no anemia   Weakness  Primary symptoms include dizziness. Pertinent negatives include no chest pain.  Pt complains of feeling weak and room spinning.  Pt concerned blood sugar was low.  (92) Pt reports she is working, has an 5711 month old.  Pt has a family history of thyroid disease and diabetes.  No cough, no fever,   Past Medical History:  Diagnosis Date  . Ectopic pregnancy    prior pregnancy    Patient Active Problem List   Diagnosis Date Noted  . Acute non-recurrent maxillary sinusitis 10/15/2015  . Hyperlipidemia 04/28/2015  . Renal cyst, left 04/25/2015  . Hx of cholecystectomy 05/01/2014  . Annual physical exam 11/22/2013  . 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 APPENDECTOMY    . CHOLECYSTECTOMY      OB History    Gravida Para Term Preterm AB Living   1             SAB TAB Ectopic Multiple Live Births                   Home Medications    Prior to Admission medications   Medication Sig Start Date End Date Taking? Authorizing Provider  fluticasone (FLONASE) 50 MCG/ACT nasal spray One spray in each nostril twice a day, use left hand for right nostril, and right hand for left nostril. 10/15/15   Monica Bectonhomas J  Thekkekandam, MD  HYDROcodone-acetaminophen (NORCO/VICODIN) 5-325 MG tablet Take 0.5 tablets by mouth every 8 (eight) hours as needed (cough). 10/15/15   Monica Bectonhomas J Thekkekandam, MD  ondansetron (ZOFRAN-ODT) 8 MG disintegrating tablet Take 1 tablet (8 mg total) by mouth every 8 (eight) hours as needed for nausea. 06/13/15   Jomarie LongsJade L Breeback, PA-C  prenatal vitamin w/FE, FA (PRENATAL 1 + 1) 27-1 MG TABS tablet Take 1 tablet by mouth daily at 12 noon.    Historical Provider, MD    Family History Family History  Problem Relation Age of Onset  . Diabetes Father     Social History Social History  Substance Use Topics  . Smoking status: Never Smoker  . Smokeless tobacco: Never Used  . Alcohol use No     Allergies   Review of patient's allergies indicates no known allergies.   Review of Systems Review of Systems  Constitutional: Positive for fatigue. Negative for activity change, appetite change, chills, fever and unexpected weight change.  HENT: Negative for congestion.   Respiratory: Negative for chest tightness.   Cardiovascular: Negative for chest pain.  Gastrointestinal: Negative for abdominal pain.  Genitourinary: Negative for menstrual problem.  Neurological: Positive for dizziness and weakness.  Psychiatric/Behavioral: The patient is not nervous/anxious.      Physical Exam Updated Vital Signs BP  108/74 (BP Location: Left Arm)   Pulse 83   Temp 98.2 F (36.8 C) (Oral)   Resp 16   Ht 5\' 2"  (1.575 m)   Wt 74.8 kg   LMP 11/19/2015   SpO2 100%   BMI 30.18 kg/m   Physical Exam  Constitutional: She appears well-developed and well-nourished. No distress.  HENT:  Head: Normocephalic and atraumatic.  Right Ear: External ear normal.  Left Ear: External ear normal.  Mouth/Throat: Oropharyngeal exudate present.  Eyes: Conjunctivae are normal.  Neck: Neck supple.  Cardiovascular: Normal rate and regular rhythm.   No murmur heard. Pulmonary/Chest: Effort normal and breath  sounds normal. No respiratory distress.  Abdominal: Soft. There is no tenderness.  Musculoskeletal: She exhibits no edema.  Neurological: She is alert.  Skin: Skin is warm and dry.  Psychiatric: She has a normal mood and affect.  Nursing note and vitals reviewed.    ED Treatments / Results  Labs (all labs ordered are listed, but only abnormal results are displayed) Labs Reviewed  CBC WITH DIFFERENTIAL/PLATELET - Abnormal; Notable for the following:       Result Value   WBC 12.9 (*)    Neutro Abs 10,191 (*)    All other components within normal limits   Narrative:    Performed at:  [facility]                [address]                [place], [place] [address]  COMPREHENSIVE METABOLIC PANEL - Abnormal; Notable for the following:    Glucose, Bld 102 (*)    All other components within normal limits   Narrative:    Performed at:  [facility]                [address]                [place], [place] [address]  TSH   Narrative:    Performed at:  [facility]                [address]                [place], [place] [address]    EKG  EKG Interpretation None       Radiology No results found.  Procedures Procedures (including critical care time)  Medications Ordered in ED Medications - No data to display   Initial Impression / Assessment and Plan / ED Course  I have reviewed the triage vital signs and the nursing notes.  Pertinent labs & imaging results that were available during my care of the patient were reviewed by me and considered in my medical decision making (see chart for details).  Clinical Course    Pt is followed by Dr. Karie Schwalbe.  I will obtain labs and make sure thyroid is normal.   Pt advised to follow up with Dr. Karie Schwalbe for recheck.  Pt has normal exam.   Final Clinical Impressions(s) / ED Diagnoses   Final diagnoses:  Dizziness  Generalized weakness    New Prescriptions Discharge  Medication List as of 11/20/2015  7:21 PM    An After Visit Summary was printed and given to the patient.   Mercedes Skinner Elaine, PA-C 11/21/15 [phone]

## 2015-11-24 ENCOUNTER — Ambulatory Visit (INDEPENDENT_AMBULATORY_CARE_PROVIDER_SITE_OTHER): Payer: PRIVATE HEALTH INSURANCE | Admitting: Sports Medicine

## 2015-11-24 ENCOUNTER — Encounter: Payer: Self-pay | Admitting: Sports Medicine

## 2015-11-24 DIAGNOSIS — R42 Dizziness and giddiness: Secondary | ICD-10-CM | POA: Diagnosis not present

## 2015-11-24 LAB — POCT URINALYSIS DIPSTICK
Bilirubin, UA: NEGATIVE
Glucose, UA: NEGATIVE
Ketones, UA: NEGATIVE
Leukocytes, UA: NEGATIVE
Nitrite, UA: NEGATIVE
Protein, UA: NEGATIVE
Spec Grav, UA: 1.01
Urobilinogen, UA: 0.2
pH, UA: 7

## 2015-11-24 LAB — POCT URINE PREGNANCY: Preg Test, Ur: NEGATIVE

## 2015-11-24 NOTE — Progress Notes (Signed)
  Subjective:    CC: fatigue, lightheadedness  HPI: 30 yo F with no significant PMH presenting with two weeks of fatigue and lightheadedness.  She says over the past two weeks she had 3 episodes of feeling jittery and clammy, "almost like my blood sugar was low".  She works as a Engineer, civil (consulting)nurse and took her BP during these episodes, and it was normal.  Her blood glucose was within normal limits as well.  [facility] one of these episodes of lightheadedness, and was found to have an elevated WBC of 12.9 at that time.  CBC, CMP, TSH were otherwise normal.  She denies any other symptoms - no URI symptoms, cough, CP, SOB, abdominal pain, nausea, vomiting, diarrhea, dysuria, urinary frequency, headaches, neck stiffness, AMS.  She is sexually active but does not think she is pregnant - she is currently on her period.    Past medical history:  Negative.  See flowsheet/record as well for more information.  Surgical history: Negative.  See flowsheet/record as well for more information.  Family history: Negative.  See flowsheet/record as well for more information.  Social history: Negative.  See flowsheet/record as well for more information.  Allergies, and medications have been entered into the medical record, reviewed, and no changes needed.   Review of Systems: No fevers, chills, night sweats, weight loss, chest pain, or shortness of breath.   Objective:    General: Well Developed, well nourished, and in no acute distress.  Neuro: Alert and oriented x3, extra-ocular muscles intact, sensation grossly intact.  HEENT: Normocephalic, atraumatic, pupils equal round reactive to light, neck supple, no masses, no lymphadenopathy, thyroid nonpalpable.  Skin: Warm and dry, no rashes. Cardiac: Regular rate and rhythm, no murmurs rubs or gallops, no lower extremity edema. 1+ pitting edema to bilateral shins.  Respiratory: Clear to auscultation bilaterally. Not using accessory muscles, speaking in full  sentences. Abdomen: soft, nondistended.  No CVA tenderness.  No guarding or rebounding.  No suprapubic tenderness    Impression and Recommendations:    1. Lightheadedness with elevated WBC: unclear etiology, will keep differential broad and repeat labwork today.  Increased HR with orthostatics, which suggests component of dehydration.  -Obtain orthostatics today: HR did increase with standing, pointing towards dehydration as the likely source of lightheadedness -CBC with peripheral smear -UA, Upregnancy negative, with exception of moderate RBCs, but patient is currently on period -BMP  -Wear compression tights at work and remain well-hydrated -Follow up in 2 weeks if symptoms persist

## 2015-11-24 NOTE — Assessment & Plan Note (Signed)
Unclear etiology. She did have some leukocytosis on a recent CBC. This suggests a viral etiology which will likely pass, symptoms haven't fact only been present for a couple of weeks. We are going to check some blood work, she did have lower extremity pitting edema, adding a BNP, urinalysis and urine pregnancy tests were negative.  Adding a peripheral smear due to peripheral leukocytosis however there are no other cell line abnormalities. Orthostatic vital signs. She will hyperhydrate herself. I have also asked her to get lower extremity compression hose

## 2015-11-25 LAB — COMPREHENSIVE METABOLIC PANEL WITH GFR
Albumin: 4.2 g/dL (ref 3.6–5.1)
Alkaline Phosphatase: 78 U/L (ref 33–115)
Calcium: 9.6 mg/dL (ref 8.6–10.2)
Potassium: 4.1 mmol/L (ref 3.5–5.3)

## 2015-11-25 LAB — CBC WITH DIFFERENTIAL/PLATELET
Basophils Absolute: 81 cells/uL (ref 0–200)
Basophils Relative: 1 %
Eosinophils Absolute: 324 {cells}/uL (ref 15–500)
Eosinophils Relative: 4 %
HCT: 39.9 % (ref 35.0–45.0)
Hemoglobin: 13.3 g/dL (ref 11.7–15.5)
Lymphocytes Relative: 27 %
Lymphs Abs: 2187 cells/uL (ref 850–3900)
MCH: 28.7 pg (ref 27.0–33.0)
MCHC: 33.3 g/dL (ref 32.0–36.0)
MCV: 86 fL (ref 80.0–100.0)
MPV: 10.9 fL (ref 7.5–12.5)
Monocytes Absolute: 648 {cells}/uL (ref 200–950)
Monocytes Relative: 8 %
Neutro Abs: 4860 cells/uL (ref 1500–7800)
Neutrophils Relative %: 60 %
Platelets: 287 K/uL (ref 140–400)
RBC: 4.64 MIL/uL (ref 3.80–5.10)
RDW: 13.2 % (ref 11.0–15.0)
WBC: 8.1 K/uL (ref 3.8–10.8)

## 2015-11-25 LAB — COMPREHENSIVE METABOLIC PANEL
ALT: 5 U/L — ABNORMAL LOW (ref 6–29)
AST: 14 U/L (ref 10–30)
BUN: 10 mg/dL (ref 7–25)
CO2: 29 mmol/L (ref 20–31)
Chloride: 103 mmol/L (ref 98–110)
Creat: 0.67 mg/dL (ref 0.50–1.10)
Glucose, Bld: 86 mg/dL (ref 65–99)
Sodium: 140 mmol/L (ref 135–146)
Total Bilirubin: 0.2 mg/dL (ref 0.2–1.2)
Total Protein: 6.7 g/dL (ref 6.1–8.1)

## 2015-11-25 LAB — PATHOLOGIST SMEAR REVIEW

## 2015-11-25 LAB — TSH: TSH: 1.7 m[IU]/L

## 2015-11-25 LAB — BRAIN NATRIURETIC PEPTIDE: Brain Natriuretic Peptide: 14.2 pg/mL (ref <100)

## 2015-12-01 LAB — D-DIMER, QUANTITATIVE: D-Dimer, Quant: 0.33 mcg/mL FEU (ref <0.50)

## 2015-12-08 ENCOUNTER — Ambulatory Visit: Payer: PRIVATE HEALTH INSURANCE | Admitting: Sports Medicine

## 2015-12-31 DIAGNOSIS — N6310 Unspecified lump in the right breast, unspecified quadrant: Secondary | ICD-10-CM | POA: Diagnosis not present

## 2015-12-31 DIAGNOSIS — N83209 Unspecified ovarian cyst, unspecified side: Secondary | ICD-10-CM | POA: Diagnosis not present

## 2015-12-31 DIAGNOSIS — R1084 Generalized abdominal pain: Secondary | ICD-10-CM | POA: Diagnosis not present

## 2015-12-31 DIAGNOSIS — N83202 Unspecified ovarian cyst, left side: Secondary | ICD-10-CM | POA: Diagnosis not present

## 2015-12-31 DIAGNOSIS — K5900 Constipation, unspecified: Secondary | ICD-10-CM | POA: Diagnosis not present

## 2015-12-31 DIAGNOSIS — N63 Unspecified lump in unspecified breast: Secondary | ICD-10-CM | POA: Diagnosis not present

## 2016-01-01 DIAGNOSIS — N83202 Unspecified ovarian cyst, left side: Secondary | ICD-10-CM | POA: Diagnosis not present

## 2016-01-01 DIAGNOSIS — N83209 Unspecified ovarian cyst, unspecified side: Secondary | ICD-10-CM | POA: Diagnosis not present

## 2016-01-01 DIAGNOSIS — N63 Unspecified lump in unspecified breast: Secondary | ICD-10-CM | POA: Diagnosis not present

## 2016-01-01 DIAGNOSIS — R9341 Abnormal radiologic findings on diagnostic imaging of renal pelvis, ureter, or bladder: Secondary | ICD-10-CM | POA: Diagnosis not present

## 2016-01-01 DIAGNOSIS — R1084 Generalized abdominal pain: Secondary | ICD-10-CM | POA: Diagnosis not present

## 2016-01-08 DIAGNOSIS — R1011 Right upper quadrant pain: Secondary | ICD-10-CM | POA: Diagnosis not present

## 2016-05-27 ENCOUNTER — Encounter: Payer: Self-pay | Admitting: Sports Medicine

## 2016-05-27 ENCOUNTER — Ambulatory Visit (INDEPENDENT_AMBULATORY_CARE_PROVIDER_SITE_OTHER): Payer: PRIVATE HEALTH INSURANCE | Admitting: Sports Medicine

## 2016-05-27 DIAGNOSIS — Z3A15 15 weeks gestation of pregnancy: Secondary | ICD-10-CM | POA: Diagnosis not present

## 2016-05-27 DIAGNOSIS — Z3490 Encounter for supervision of normal pregnancy, unspecified, unspecified trimester: Secondary | ICD-10-CM | POA: Insufficient documentation

## 2016-05-27 DIAGNOSIS — M533 Sacrococcygeal disorders, not elsewhere classified: Secondary | ICD-10-CM | POA: Diagnosis not present

## 2016-05-27 MED ORDER — HYDROCODONE-ACETAMINOPHEN 5-325 MG PO TABS: 1.0000 | ORAL_TABLET | Freq: Every day | ORAL | 0 refills | Status: DC

## 2016-05-27 NOTE — Assessment & Plan Note (Signed)
Home rehabilitation exercises given, continue Tylenol, adding low-dose hydrocodone to use at bedtime for 2 weeks, if insufficient relief after 2-4 weeks I am going to do a left SI joint injection. Considering pregnancy we will avoid NSAIDs and x-rays.

## 2016-05-27 NOTE — Progress Notes (Signed)
  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 Worse with weightbearing, leaning back. No bowel or bladder dysfunction, saddle numbness, no constitutional symptoms.  Past medical history:  Negative.  See flowsheet/record as well for more information.  Surgical history: Negative.  See flowsheet/record as well for more information.  Family history: Negative.  See flowsheet/record as well for more information.  Social history: Negative.  See flowsheet/record as well for more information.  Allergies, and medications have been entered into the medical record, reviewed, and no changes needed.   Review of Systems: No fevers, chills, night sweats, weight loss, chest pain, or shortness of breath.   Objective:    General: Well Developed, well nourished, and in no acute distress.  Neuro: Alert and oriented x3, extra-ocular muscles intact, sensation grossly intact.  HEENT: Normocephalic, atraumatic, pupils equal round reactive to light, neck supple, no masses, no lymphadenopathy, thyroid nonpalpable.  Skin: Warm and dry, no rashes. Cardiac: Regular rate and rhythm, no murmurs rubs or gallops, no lower extremity edema.  Respiratory: Clear to auscultation bilaterally. Not using accessory muscles, speaking in full sentences. Back Exam:  Inspection: Unremarkable  Motion: Flexion 45 deg, Extension 45 deg, Side Bending to 45 deg bilaterally,  Rotation to 45 deg bilaterally  SLR laying: Negative  XSLR laying: Negative  Palpable tenderness: Left sacroiliac joint. FABER: Positive on the left. Sensory change: Gross sensation intact to all lumbar and sacral dermatomes.  Reflexes: 2+ at both patellar tendons, 2+ at achilles tendons, Babinski's downgoing.  Strength at foot    Plantar-flexion: 5/5 Dorsi-flexion: 5/5 Eversion: 5/5 Inversion: 5/5  Leg strength  Quad: 5/5 Hamstring: 5/5 Hip flexor: 5/5 Hip abductors: 5/5  Gait unremarkable.  Impression and Recommendations:    Sacroiliac joint dysfunction of left side Home rehabilitation exercises given, continue Tylenol, adding low-dose hydrocodone to use at bedtime for 2 weeks, if insufficient relief after 2-4 weeks I am going to do a left SI joint injection. Considering pregnancy we will avoid NSAIDs and x-rays.  I spent 25 minutes with this patient, greater than 50% was face-to-face time counseling regarding the above diagnoses

## 2016-06-21 ENCOUNTER — Ambulatory Visit (INDEPENDENT_AMBULATORY_CARE_PROVIDER_SITE_OTHER): Payer: PRIVATE HEALTH INSURANCE | Admitting: Sports Medicine

## 2016-06-21 ENCOUNTER — Encounter: Payer: Self-pay | Admitting: Sports Medicine

## 2016-06-21 DIAGNOSIS — M533 Sacrococcygeal disorders, not elsewhere classified: Secondary | ICD-10-CM | POA: Diagnosis not present

## 2016-06-21 DIAGNOSIS — J0100 Acute maxillary sinusitis, unspecified: Secondary | ICD-10-CM | POA: Diagnosis not present

## 2016-06-21 MED ORDER — HYDROCODONE-HOMATROPINE 5-1.5 MG/5ML PO SYRP: 5.0000 mL | ORAL_SOLUTION | Freq: Three times a day (TID) | ORAL | 0 refills | Status: DC | PRN

## 2016-06-21 MED ORDER — FLUTICASONE PROPIONATE 50 MCG/ACT NA SUSP: NASAL | 3 refills | Status: DC

## 2016-06-21 MED ORDER — AMOXICILLIN-POT CLAVULANATE 875-125 MG PO TABS: 1.0000 | ORAL_TABLET | Freq: Two times a day (BID) | ORAL | 0 refills | Status: AC

## 2016-06-21 NOTE — Progress Notes (Signed)
  Subjective:    CC: Low back pain  HPI: Mercedes Miller returns, she is [redacted] weeks pregnant, having low back pain on the left consistent with SI joint dysfunction as is common in pregnancy. We started conservatively with some oral analgesics, we have waited x-rays and NSAIDs due to her pregnancy, and have her do aggressive rehabilitation exercises, unfortunately she hasn't improved, at all. She does desire interventional treatment today, pain is moderate, persistent, localized over the left sacroiliac joint without radiation.  Sinus infection: For the past several days has had increasing pain and pressure over the left maxillary sinus, nasal discharge and coughing, feels like previous sinus infections.  Past medical history:  Negative.  See flowsheet/record as well for more information.  Surgical history: Negative.  See flowsheet/record as well for more information.  Family history: Negative.  See flowsheet/record as well for more information.  Social history: Negative.  See flowsheet/record as well for more information.  Allergies, and medications have been entered into the medical record, reviewed, and no changes needed.   Review of Systems: No fevers, chills, night sweats, weight loss, chest pain, or shortness of breath.   Objective:    General: Well Developed, well nourished, and in no acute distress.  Neuro: Alert and oriented x3, extra-ocular muscles intact, sensation grossly intact.  HEENT: Normocephalic, atraumatic, pupils equal round reactive to light, neck supple, no masses, no lymphadenopathy, thyroid nonpalpable.  Skin: Warm and dry, no rashes. Cardiac: Regular rate and rhythm, no murmurs rubs or gallops, no lower extremity edema.  Respiratory: Clear to auscultation bilaterally. Not using accessory muscles, speaking in full sentences.  Procedure: Real-time Ultrasound Guided Injection of left sacroiliac joint Device: GE Logiq E  Verbal informed consent obtained.  Time-out conducted.    Noted no overlying erythema, induration, or other signs of local infection.  Skin prepped in a sterile fashion.  Local anesthesia: Topical Ethyl chloride.  With sterile technique and under real time ultrasound guidance:  22-gauge spinal needle advanced into the sacroiliac joint and 1 mL kenalog 40, 2 mL lidocaine, 2 mL bupivacaine injected easily. Completed without difficulty  Pain immediately resolved suggesting accurate placement of the medication.  Advised to call if fevers/chills, erythema, induration, drainage, or persistent bleeding.  Images permanently stored and available for review in the ultrasound unit.  Impression: Technically successful ultrasound guided injection.  Impression and Recommendations:    Sacroiliac joint dysfunction of left side Now 18 weeks of pregnancy. Home exercise regimen did not provide any relief. We did avoid x-rays and NSAIDs considering her pregnancy. Left sided SI joint injection today. Return in one month.  Acute non-recurrent maxillary sinusitis Augmentin, Flonase. Hycodan for cough.

## 2016-06-21 NOTE — Assessment & Plan Note (Signed)
Augmentin, Flonase. Hycodan for cough.

## 2016-06-21 NOTE — Assessment & Plan Note (Signed)
Now 18 weeks of pregnancy. Home exercise regimen did not provide any relief. We did avoid x-rays and NSAIDs considering her pregnancy. Left sided SI joint injection today. Return in one month.

## 2016-06-30 ENCOUNTER — Emergency Department
Admission: EM | Admit: 2016-06-30 | Discharge: 2016-06-30 | Disposition: A | Discharge status: Home / Self Care | Payer: PRIVATE HEALTH INSURANCE | Source: Home / Self Care | Attending: Family Medicine | Admitting: Family Medicine

## 2016-06-30 ENCOUNTER — Telehealth: Payer: Self-pay

## 2016-06-30 ENCOUNTER — Encounter: Payer: Self-pay | Admitting: *Deleted

## 2016-06-30 DIAGNOSIS — B370 Candidal stomatitis: Secondary | ICD-10-CM

## 2016-06-30 MED ORDER — NYSTATIN 100000 UNIT/ML MT SUSP: 500000.0000 [IU] | Freq: Four times a day (QID) | OROMUCOSAL | 0 refills | Status: DC

## 2016-06-30 NOTE — Telephone Encounter (Signed)
Pt left VM stating she feels like she has thrush after taking augmentin. Please advise.

## 2016-06-30 NOTE — ED Triage Notes (Signed)
Pt c/o sore throat and red spots on her throat after taking amoxicillin since 06/21/16 for sinusitis.

## 2016-06-30 NOTE — Telephone Encounter (Signed)
This would be uncommon but I'm going to add a nystatin gargle and spit.

## 2016-06-30 NOTE — ED Provider Notes (Signed)
CSN: [id number]     Arrival date & time 06/30/16  1923 History   First MD Initiated Contact with Patient 06/30/16 1943     Chief Complaint  Patient presents with  . Sore Throat   (Consider location/radiation/quality/duration/timing/severity/associated sxs/prior Treatment) HPI  Mercedes Miller is a 31 y.o. female presenting to UC with c/o gradually worsening sore throat for the last 2-3 days.  Pt was seen by her PCP on 06/21/16, dx and treated for sinusitis with amoxicillin. She still has about 2 days left of the medication but noticed white and red spots on the back of her throat. The sinus pain and pressure has since resolved.  She has been able to keep down fluids. She is concerned she has thrush as she is currently pregnant and currently on the amoxicillin. No prior hx of thrush. No hx of DM.  Denies fever, chills, rash, or other symptoms. She did call her PCP earlier today who called in some oral nystatin to swish, gargle, and spit. Pt wanted to be evaluated to make sure she did have thrush and not "something else."   Past Medical History:  Diagnosis Date  . Ectopic pregnancy    prior pregnancy   Past Surgical History:  Procedure Laterality Date  . APPENDECTOMY    . CHOLECYSTECTOMY     Family History  Problem Relation Age of Onset  . Diabetes Father    Social History  Substance Use Topics  . Smoking status: Never Smoker  . Smokeless tobacco: Never Used  . Alcohol use No   OB History    Gravida Para Term Preterm AB Living   2             SAB TAB Ectopic Multiple Live Births                 Review of Systems  Constitutional: Negative for chills and fever.  HENT: Positive for sore throat. Negative for congestion, ear pain, trouble swallowing and voice change.   Respiratory: Negative for cough and shortness of breath.   Cardiovascular: Negative for chest pain and palpitations.  Gastrointestinal: Negative for abdominal pain, diarrhea, nausea and vomiting.  Musculoskeletal:  Negative for arthralgias, back pain and myalgias.  Skin: Negative for rash.    Allergies  Patient has no known allergies.  Home Medications   Prior to Admission medications   Medication Sig Start Date End Date Taking? Authorizing Provider  HYDROcodone-homatropine (HYCODAN) 5-1.5 MG/5ML syrup Take 5 mLs by mouth every 6 (six) hours as needed for cough.   Yes [provider]  amoxicillin-clavulanate (AUGMENTIN) 875-125 MG tablet Take 1 tablet by mouth 2 (two) times daily. 06/21/16 07/01/16  Monica Becton, MD  fluticasone (FLONASE) 50 MCG/ACT nasal spray One spray in each nostril twice a day, use left hand for right nostril, and right hand for left nostril. 06/21/16   Monica Becton, MD  nystatin (MYCOSTATIN) 100000 UNIT/ML suspension Take 5 mLs (500,000 Units total) by mouth 4 (four) times daily. Swish for 30 seconds and spit out. 06/30/16   Monica Becton, MD  prenatal vitamin w/FE, FA (PRENATAL 1 + 1) 27-1 MG TABS tablet Take 1 tablet by mouth daily at 12 noon.    [provider]   Meds Ordered and Administered this Visit  Medications - No data to display  BP 125/76 (BP Location: Left Arm)   Pulse 85   Temp 98.9 F (37.2 C) (Oral)   Resp 16   Ht 5\' 2"  (1.575  m)   Wt 182 lb (82.6 kg)   LMP  (LMP Unknown)   SpO2 100%   BMI 33.29 kg/m  No data found.   Physical Exam  Constitutional: She is oriented to person, place, and time. She appears well-developed and well-nourished. No distress.  HENT:  Head: Normocephalic and atraumatic.  Right Ear: Tympanic membrane normal.  Left Ear: Tympanic membrane normal.  Nose: Nose normal.  Mouth/Throat: Uvula is midline and mucous membranes are normal. No trismus in the jaw. No uvula swelling. Oropharyngeal exudate and posterior oropharyngeal erythema present. No posterior oropharyngeal edema or tonsillar abscesses.  Tonsillar erythema w/o obvious edema. Scant white patches on tonsils.  Soft and hard  palate- erythematous red lesions and sores. No ulcerations.  Eyes: EOM are normal.  Neck: Normal range of motion.  Cardiovascular: Normal rate and regular rhythm.   Pulmonary/Chest: Effort normal and breath sounds normal. No respiratory distress. She has no wheezes. She has no rales.  Musculoskeletal: Normal range of motion.  Neurological: She is alert and oriented to person, place, and time.  Skin: Skin is warm and dry. She is not diaphoretic.  Psychiatric: She has a normal mood and affect. Her behavior is normal.  Nursing note and vitals reviewed.   Urgent Care Course     Procedures (including critical care time)  Labs Review Labs Reviewed - No data to display  Imaging Review No results found.  MDM   1. Oral thrush    Pt c/o sore throat that is worsening despite being on amoxicillin for a sinus infection. Sinus symptoms have resolved. Exam most c/w oral thrush.   Questionable hand-foot-mouth given appearance of posterior pharynx, however, no other rashes   Encouraged to use the nystatin as prescribed.  f/u with PCP in 3-4 days if not improving, sooner if worsening.   Junius FinnerO'Malley, Maxim Bedel, PA-C 07/01/16 [phone] [id number]

## 2016-07-01 NOTE — Telephone Encounter (Signed)
Left VM with information.  

## 2016-07-20 ENCOUNTER — Ambulatory Visit: Payer: PRIVATE HEALTH INSURANCE | Admitting: Sports Medicine

## 2016-10-01 ENCOUNTER — Telehealth: Payer: Self-pay | Admitting: *Deleted

## 2016-10-01 NOTE — Telephone Encounter (Signed)
She can use Tylenol 650 mg 1 tab by mouth 3 times a day. Do the rehabilitation exercises, and get into see me for an injection as soon as she can. Did the injection seemed to help back in April?

## 2016-10-01 NOTE — Telephone Encounter (Signed)
Patient called stating she is having some pain from SI joint dysfuntion. She was seen for this on 06/21/2016.In her message she says she is unable to come into the office because she is going out of town. She is something for her pain. Of note the patient is [redacted] weeks pregnant. Please advise

## 2016-10-01 NOTE — Telephone Encounter (Signed)
Patient notified

## 2017-03-02 ENCOUNTER — Ambulatory Visit: Payer: PRIVATE HEALTH INSURANCE | Admitting: Physician Assistant

## 2017-03-17 ENCOUNTER — Encounter: Payer: Self-pay | Admitting: Sports Medicine

## 2017-03-17 ENCOUNTER — Ambulatory Visit (INDEPENDENT_AMBULATORY_CARE_PROVIDER_SITE_OTHER): Payer: PRIVATE HEALTH INSURANCE

## 2017-03-17 ENCOUNTER — Ambulatory Visit (INDEPENDENT_AMBULATORY_CARE_PROVIDER_SITE_OTHER): Payer: PRIVATE HEALTH INSURANCE | Admitting: Sports Medicine

## 2017-03-17 DIAGNOSIS — R1011 Right upper quadrant pain: Secondary | ICD-10-CM

## 2017-03-17 DIAGNOSIS — J0100 Acute maxillary sinusitis, unspecified: Secondary | ICD-10-CM

## 2017-03-17 DIAGNOSIS — R0989 Other specified symptoms and signs involving the circulatory and respiratory systems: Secondary | ICD-10-CM

## 2017-03-17 DIAGNOSIS — R05 Cough: Secondary | ICD-10-CM | POA: Diagnosis not present

## 2017-03-17 MED ORDER — AZITHROMYCIN 250 MG PO TABS: ORAL_TABLET | ORAL | 0 refills | Status: DC

## 2017-03-17 MED ORDER — PANTOPRAZOLE SODIUM 40 MG PO TBEC: 40.0000 mg | DELAYED_RELEASE_TABLET | Freq: Two times a day (BID) | ORAL | 3 refills | Status: DC

## 2017-03-17 MED ORDER — HYDROCOD POLST-CPM POLST ER 10-8 MG/5ML PO SUER
5.0000 mL | Freq: Two times a day (BID) | ORAL | 0 refills | Status: DC | PRN
Start: 2017-03-17 — End: 2017-04-14

## 2017-03-17 MED ORDER — SUCRALFATE 1 G PO TABS: 1.0000 g | ORAL_TABLET | Freq: Four times a day (QID) | ORAL | 3 refills | Status: DC

## 2017-03-17 MED ORDER — PREDNISONE 50 MG PO TABS: ORAL_TABLET | ORAL | 0 refills | Status: DC

## 2017-03-17 NOTE — Assessment & Plan Note (Signed)
With bronchitis in the right lower lobe coarse lung sounds. Prednisone, azithromycin, Tussionex, chest x-ray.

## 2017-03-17 NOTE — Assessment & Plan Note (Signed)
Post cholecystectomy, persistent right upper quadrant postprandial discomfort. 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 She did have an upper endoscopy about 1.5 years ago, I am unable to see the results for this. Adding pantoprazole to be taken twice a day and Carafate, return in 1 month for this, if insufficient relief we will need to proceed with another endoscopy.

## 2017-03-17 NOTE — Progress Notes (Signed)
Subjective:    CC: Persistent cough  HPI: Kelton PillarCharity is a pleasant 32 year old female, for the past 3 weeks she has had a cough, initially treated with amoxicillin, no steroids.  She also has significant maxillary sinus pressure at the time.  Cough was not changed, she had a slight improvement in her maxillary sinus pressure which then flared again.  Radiation of pain to the ears into the upper teeth.  Cough is nonproductive.  In addition she has had persistent right upper quadrant and epigastric pain, she is post cholecystectomy.  Pain and discomfort feels more like pressure, and is worse after eating.  No nausea, vomiting, diarrhea, constipation.  She did have an upper and lower endoscopy in 2017, I do not have the records for this.  I reviewed the past medical history, family history, social history, surgical history, and allergies today and no changes were needed.  Please see the problem list section below in epic for further details.  Past Medical History: Past Medical History:  Diagnosis Date  . Ectopic pregnancy    prior pregnancy   Past Surgical History: Past Surgical History:  Procedure Laterality Date  . APPENDECTOMY    . CHOLECYSTECTOMY     Social History: Social History   Socioeconomic History  . Marital status: Married    Spouse name: None  . Number of children: None  . Years of education: None  . Highest education level: None  Social Needs  . Financial resource strain: None  . Food insecurity - worry: None  . Food insecurity - inability: None  . Transportation needs - medical: None  . Transportation needs - non-medical: None  Occupational History  . None  Tobacco Use  . Smoking status: Never Smoker  . Smokeless tobacco: Never Used  Substance and Sexual Activity  . Alcohol use: No  . Drug use: No  . Sexual activity: None  Other Topics Concern  . None  Social History Narrative  . None   Family History: Family History  Problem Relation Age of Onset  .  Diabetes Father    Allergies: No Known Allergies Medications: See med rec.  Review of Systems: No fevers, chills, night sweats, weight loss, chest pain, or shortness of breath.   Objective:    General: Well Developed, well nourished, and in no acute distress.  Neuro: Alert and oriented x3, extra-ocular muscles intact, sensation grossly intact.  HEENT: Normocephalic, atraumatic, pupils equal round reactive to light, neck supple, no masses, no lymphadenopathy, thyroid nonpalpable.  Oropharynx, nasopharynx, ear canals unremarkable, tenderness to palpation over the maxillary sinuses. Skin: Warm and dry, no rashes. Cardiac: Regular rate and rhythm, no murmurs rubs or gallops, no lower extremity edema.  Respiratory: Coarse sounds in the right lung base. Not using accessory muscles, speaking in full sentences.  Impression and Recommendations:    Acute non-recurrent maxillary sinusitis With bronchitis in the right lower lobe coarse lung sounds. Prednisone, azithromycin, Tussionex, chest x-ray.  Postprandial RUQ pain Post cholecystectomy, persistent right upper quadrant postprandial discomfort. 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 She did have an upper endoscopy about 1.5 years ago, I am unable to see the results for this. Adding pantoprazole to be taken twice a day and Carafate, return in 1 month for this, if insufficient relief we will need to proceed with another endoscopy.  __________________________________________ Ihor Austinhomas J. Benjamin Stainhekkekandam, M.D., ABFM., CAQSM. Primary Care and Sports Medicine [facility]  Adjunct Instructor of Family Medicine  [employer]

## 2017-04-07 ENCOUNTER — Ambulatory Visit: Payer: PRIVATE HEALTH INSURANCE | Admitting: Sports Medicine

## 2017-04-11 ENCOUNTER — Ambulatory Visit: Payer: PRIVATE HEALTH INSURANCE | Admitting: Sports Medicine

## 2017-04-14 ENCOUNTER — Ambulatory Visit: Payer: PRIVATE HEALTH INSURANCE | Admitting: Sports Medicine

## 2017-04-14 ENCOUNTER — Encounter: Payer: Self-pay | Admitting: Sports Medicine

## 2017-04-14 DIAGNOSIS — R1011 Right upper quadrant pain: Secondary | ICD-10-CM

## 2017-04-14 DIAGNOSIS — Z3A15 15 weeks gestation of pregnancy: Secondary | ICD-10-CM | POA: Diagnosis not present

## 2017-04-14 LAB — POCT URINALYSIS DIPSTICK
Bilirubin, UA: NEGATIVE
Blood, UA: NEGATIVE
Glucose, UA: NEGATIVE
Ketones, UA: NEGATIVE
Nitrite, UA: NEGATIVE
Protein, UA: NEGATIVE
Spec Grav, UA: 1.01 (ref 1.010–1.025)
Urobilinogen, UA: 0.2 U/dL
pH, UA: 6.5 (ref 5.0–8.0)

## 2017-04-14 LAB — POCT URINE PREGNANCY: Preg Test, Ur: NEGATIVE

## 2017-04-14 MED ORDER — CEPHALEXIN 500 MG PO CAPS: 500.0000 mg | ORAL_CAPSULE | Freq: Two times a day (BID) | ORAL | 0 refills | Status: DC

## 2017-04-14 NOTE — Assessment & Plan Note (Addendum)
Too soon to tell whether this is a tubal pregnancy or intrauterine. She does have follow-up with OB/GYN. Her urine pregnancy test was negative today, sending for a beta-hCG.

## 2017-04-14 NOTE — Assessment & Plan Note (Addendum)
Persistent postprandial, 30 minutes after eating right upper quadrant abdominal pain. Pantoprazole and Carafate provided no relief whatsoever. Pain is not positional. At this point I do think we need gastroenterology to weigh in.   Adding labs, as well as a urinalysis and urine culture. Repeat abdominal ultrasound. Urinalysis did show some pyuria, adding Keflex.

## 2017-04-14 NOTE — Progress Notes (Signed)
Subjective:    CC: Abdominal pain  HPI: Mercedes Miller is a pleasant 32 year old female, she is post cholecystectomy, we have treating her for some time now for postprandial right upper quadrant discomfort, soreness, fullness.  At the last visit we started pantoprazole and Carafate without much improvement.  She denies any hematochezia, melena, hematemesis.  She is fairly recently postpartum, had an IUD insertion visit it was noted that she had a positive urine pregnancy test.  An ultrasound did not show intrauterine nor tubal pregnancy though it is too early to tell.  She has not yet had a beta hCG checked.  I reviewed the past medical history, family history, social history, surgical history, and allergies today and no changes were needed.  Please see the problem list section below in epic for further details.  Past Medical History: Past Medical History:  Diagnosis Date  . Ectopic pregnancy    prior pregnancy   Past Surgical History: Past Surgical History:  Procedure Laterality Date  . APPENDECTOMY    . CHOLECYSTECTOMY     Social History: Social History   Socioeconomic History  . Marital status: Married    Spouse name: None  . Number of children: None  . Years of education: None  . Highest education level: None  Social Needs  . Financial resource strain: None  . Food insecurity - worry: None  . Food insecurity - inability: None  . Transportation needs - medical: None  . Transportation needs - non-medical: None  Occupational History  . None  Tobacco Use  . Smoking status: Never Smoker  . Smokeless tobacco: Never Used  Substance and Sexual Activity  . Alcohol use: No  . Drug use: No  . Sexual activity: None  Other Topics Concern  . None  Social History Narrative  . None   Family History: Family History  Problem Relation Age of Onset  . Diabetes Father    Allergies: No Known Allergies Medications: See med rec.  Review of Systems: No fevers, chills, night sweats,  weight loss, chest pain, or shortness of breath.   Objective:    General: Well Developed, well nourished, and in no acute distress.  Neuro: Alert and oriented x3, extra-ocular muscles intact, sensation grossly intact.  HEENT: Normocephalic, atraumatic, pupils equal round reactive to light, neck supple, no masses, no lymphadenopathy, thyroid nonpalpable.  Skin: Warm and dry, no rashes. Cardiac: Regular rate and rhythm, no murmurs rubs or gallops, no lower extremity edema.  Respiratory: Clear to auscultation bilaterally. Not using accessory muscles, speaking in full sentences. Abdomen: Soft, tender to palpation of the right upper quadrant, minimally distended, no guarding, rigidity, rebound tenderness.  No overt costovertebral angle pain.  Urinalysis is positive for pyuria, urine pregnancy test is negative  Impression and Recommendations:    Postprandial RUQ pain Persistent postprandial, 30 minutes after eating right upper quadrant abdominal pain. Pantoprazole and Carafate provided no relief whatsoever. Pain is not positional. At this point I do think we need gastroenterology to weigh in.   Adding labs, as well as a urinalysis and urine culture. Repeat abdominal ultrasound. Urinalysis did show some pyuria, adding Keflex.  Pregnant Too soon to tell whether this is a tubal pregnancy or intrauterine. She does have follow-up with OB/GYN. Her urine pregnancy test was negative today, sending for a beta-hCG.  I spent 25 minutes with this patient, greater than 50% was face-to-face time counseling regarding the above diagnoses ___________________________________________ Ihor Austinhomas J. Benjamin Stainhekkekandam, M.D., ABFM., CAQSM. Primary Care and Sports Medicine [facility]  [facility]  Adjunct Instructor of [employer]

## 2017-04-15 LAB — URINE CULTURE
MICRO NUMBER:: 90231625
Result:: NO GROWTH
SPECIMEN QUALITY:: ADEQUATE

## 2017-04-15 LAB — COMPREHENSIVE METABOLIC PANEL
ALT: 10 U/L (ref 6–29)
AST: 16 U/L (ref 10–30)
Albumin: 4.6 g/dL (ref 3.6–5.1)
Alkaline phosphatase (APISO): 106 U/L (ref 33–115)
BUN: 10 mg/dL (ref 7–25)
Calcium: 9.6 mg/dL (ref 8.6–10.2)
Chloride: 105 mmol/L (ref 98–110)
Creat: 0.73 mg/dL (ref 0.50–1.10)
Total Bilirubin: 0.3 mg/dL (ref 0.2–1.2)

## 2017-04-15 LAB — CBC WITH DIFFERENTIAL/PLATELET
Basophils Absolute: 102 cells/uL (ref 0–200)
Basophils Relative: 1.1 %
Eosinophils Absolute: 242 {cells}/uL (ref 15–500)
Eosinophils Relative: 2.6 %
HCT: 38.6 % (ref 35.0–45.0)
Hemoglobin: 13.6 g/dL (ref 11.7–15.5)
Lymphs Abs: 2111 {cells}/uL (ref 850–3900)
MCH: 29.4 pg (ref 27.0–33.0)
MCHC: 35.2 g/dL (ref 32.0–36.0)
MCV: 83.5 fL (ref 80.0–100.0)
MPV: 10.9 fL (ref 7.5–12.5)
Monocytes Relative: 7.2 %
Neutro Abs: 6175 {cells}/uL (ref 1500–7800)
Neutrophils Relative %: 66.4 %
Platelets: 284 10*3/uL (ref 140–400)
RBC: 4.62 10*6/uL (ref 3.80–5.10)
RDW: 11.9 % (ref 11.0–15.0)
Total Lymphocyte: 22.7 %
WBC mixed population: 670 cells/uL (ref 200–950)
WBC: 9.3 10*3/uL (ref 3.8–10.8)

## 2017-04-15 LAB — COMPREHENSIVE METABOLIC PANEL WITH GFR
AG Ratio: 1.6 (calc) (ref 1.0–2.5)
CO2: 27 mmol/L (ref 20–32)
Globulin: 2.9 g/dL (ref 1.9–3.7)
Glucose, Bld: 87 mg/dL (ref 65–99)
Potassium: 4.1 mmol/L (ref 3.5–5.3)
Sodium: 138 mmol/L (ref 135–146)
Total Protein: 7.5 g/dL (ref 6.1–8.1)

## 2017-04-15 LAB — HCG, QUANTITATIVE, PREGNANCY: HCG, Total, QN: <2 m[IU]/mL

## 2017-04-15 LAB — LIPASE: Lipase: 14 U/L (ref 7–60)

## 2017-04-15 LAB — AMYLASE: Amylase: 20 U/L — ABNORMAL LOW (ref 21–101)

## 2017-04-19 ENCOUNTER — Telehealth: Payer: Self-pay

## 2017-04-19 NOTE — Telephone Encounter (Signed)
Pt left VM stating she did get her IUD placed but before she did an urinalysis and it had blood in it. Would like to know what she should do at this point.

## 2017-04-19 NOTE — Telephone Encounter (Signed)
Finish the antibiotic, she did have some white blood cells in the urine at the last check, urinary tract infections often do come with a bit of microscopic hematuria.   we will also get more information with the ultrasound to see if there is any renal stones.

## 2017-04-20 ENCOUNTER — Other Ambulatory Visit: Payer: PRIVATE HEALTH INSURANCE

## 2017-04-21 NOTE — Telephone Encounter (Signed)
Left VM

## 2017-04-28 ENCOUNTER — Ambulatory Visit: Payer: PRIVATE HEALTH INSURANCE | Admitting: Sports Medicine

## 2017-04-28 DIAGNOSIS — Z0189 Encounter for other specified special examinations: Secondary | ICD-10-CM

## 2017-05-03 ENCOUNTER — Encounter: Payer: Self-pay | Admitting: Nurse Practitioner

## 2017-05-18 ENCOUNTER — Ambulatory Visit: Payer: PRIVATE HEALTH INSURANCE | Admitting: Nurse Practitioner

## 2017-05-18 ENCOUNTER — Encounter: Payer: Self-pay | Admitting: Nurse Practitioner

## 2017-05-18 VITALS — BP 120/82 | HR 76 | Ht 62.0 in | Wt 184.4 lb

## 2017-05-18 DIAGNOSIS — K649 Unspecified hemorrhoids: Secondary | ICD-10-CM

## 2017-05-18 DIAGNOSIS — R194 Change in bowel habit: Secondary | ICD-10-CM

## 2017-05-18 DIAGNOSIS — K625 Hemorrhage of anus and rectum: Secondary | ICD-10-CM

## 2017-05-18 DIAGNOSIS — R1011 Right upper quadrant pain: Secondary | ICD-10-CM | POA: Diagnosis not present

## 2017-05-18 MED ORDER — HYDROCORTISONE 2.5 % RE CREA: 1.0000 "application " | TOPICAL_CREAM | Freq: Every day | RECTAL | 0 refills | Status: DC

## 2017-05-18 MED ORDER — LIDOCAINE 5 % EX PTCH: 1.0000 | MEDICATED_PATCH | CUTANEOUS | 0 refills | Status: DC

## 2017-05-18 NOTE — Patient Instructions (Addendum)
If you are age 32 or older, your body mass index should be between 23-30. Your Body mass index is 33.72 kg/m. If this is out of the aforementioned range listed, please consider follow up with your Primary Care Provider.  If you are age 32 or younger, your body mass index should be between 19-25. Your Body mass index is 33.72 kg/m. If this is out of the aformentioned range listed, please consider follow up with your Primary Care Provider.   We have sent the following medications to your pharmacy for you to pick up at your convenience: Anusol Cream 2.5 %  Lidoderm Patch  STOP Pantoprazole.  If GERD symptoms occur try Zantac 150 mg every day instead.  Start Fiber - Metamucil daily.  Continue  8 glasses of water daily.  Follow up with Willette ClusterPaula Guenther, NP on June 20, 2017 at 1:30 pm.    Thank you for choosing me and [facility].   Willette ClusterPaula Guenther, NP

## 2017-05-18 NOTE — Progress Notes (Signed)
Chief Complaint: RUQ pain  Referring Provider:  Rodney Langtonhekkekandam, Thomas, MD   ASSESSMENT AND PLAN;   1. 32 yo female with intermittent chronic RUQ pain ( 4-5 years) described as "pressure". Also with altered bowel habits. Discomfort unrelated to eating, transiently improved with defecation and OTC analgesics also help. [facility]. Post cholecystectomy U/S in 2017, MRI of abd in 2017 (done for evaluation of a renal lesion) also unremarkable. She also reports recent negative abd CT scan done for evaluation of hematuria though that may have been without contrast for stone protocol . Abdominal exam is negative but I still wonder about abdominal wall pain. She does have alternating constipation / diarrhea so IBS is on list of differential DDx. -anti-spasmotics didn't help in past so not prescribing  -Recommended trial of metamucil to help regulate bowels -trial of lidoderm patch to RUQ   2. GERD associated with pregnancy but has remained on PPI -recommended she try and discontinue PPI as it hasn't helped RUQ pain and her GERD sx may have just been pregnancy related. If recurrent GERD sx she can try OTC Zantac or Pepcid AC.   3. Self -limited minor rectal bleeding / discomfort with BMs in setting of constipation, resolved. Normal colonoscopy 2017.  -She will contact us if recurrent sx.   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  She has several GI issues to discuss.    Three to four months after birth of first child ( 2015) she developed RUQ pain described as pressure. Pain radiates around right side to flank but not all the way to her back. Pain is transiently relieved with defecation, OTC analgesics also help. Pain almost non-existent when she is pregnant. She may go days without pain then other times pain is constant. When pain began [facility], U/S showed sludge, [facility]  [facility] and was scheduled for cholecystectomy but found to be pregnant with second child so surgery postponed for about 11 months. Got cholecystectomy and within a month the RUQ was back. Follow up abdominal u /s was unremarkable. Surgery referred [facility]. Started on Bentyl and scheduled for EGD / colonoscopy.  Colonoscopy with TI intubation was normal November 2017.  I do not have EGD results but patient said it was completely normal. Bentyl didn't help. Tried on Protonix and Carafate but no significant improvement in the RUQ pain. Stopped Carafate, has remained on PPI. Of note, she had a negative abd MRI in March 2017 for evaluation of a renal lesion found on renal u/s.    Most recent labs drawn 04/14/17 including CBC, lipase and LFTs were normal.   Kennie describes irregular bowel habits with alternating constipation / diarrhea a lot of the tim but then goes through periods when bowel pattern is normal. She recently experienced anorectal discomfort and minor rectal bleeding associated with firm stools which felt like they scraped her when coming out. She did try stool softeners but results unpredictable and sometimes resulted in diarrhea.    IUD placed three weeks ago. Just prior to placement urine preg test was positive but serum test was negative. U/A + blood,  [place] which she say was normal. She gets nauseated if goes hours without food but feels this is related to recent IUD placement / hormones and  already improving. No weight loss.    Past Surgical History:  Procedure Laterality Date  . APPENDECTOMY    . CHOLECYSTECTOMY  2016  . TONSILECTOMY/ADENOIDECTOMY WITH MYRINGOTOMY     Family History  Problem Relation Age of Onset  . Diabetes Father   . Hypothyroidism Father   . Heart disease Father   . Hypothyroidism Mother   . Colon cancer Paternal Grandmother   . Colon cancer Paternal Grandfather   . Ovarian cancer Maternal Aunt      Social History   Tobacco Use  . Smoking status: Never Smoker  . Smokeless tobacco: Never Used  Substance Use Topics  . Alcohol use: No  . Drug use: No   Current Outpatient Medications  Medication Sig Dispense Refill  . pantoprazole (PROTONIX) 40 MG tablet Take 1 tablet (40 mg total) by mouth 2 (two) times daily. 60 tablet 3  . prenatal vitamin w/FE, FA (PRENATAL 1 + 1) 27-1 MG TABS tablet Take 1 tablet by mouth daily at 12 noon.     No current facility-administered medications for this visit.    No Known Allergies   Review of Systems: All systems reviewed and negative except where noted in HPI.    Physical Exam:    BP 120/82   Pulse 76   Ht 5\' 2"  (1.575 m)   Wt 184 lb 6 oz (83.6 kg)   BMI 33.72 kg/m  Constitutional:  Well-developed, white female in no acute distress. Psychiatric: Normal mood and affect. Behavior is normal. EENT: Pupils normal.  Conjunctivae are normal. No scleral icterus. Neck supple.  Cardiovascular: Normal rate, regular rhythm. No edema Pulmonary/chest: Effort normal and breath sounds normal. No wheezing, rales or rhonchi. Abdominal: Soft, nondistended. Nontender. Bowel sounds active throughout. There are no masses palpable. No hepatomegaly. Neurological: Alert and oriented to person place and time. Skin: Skin is warm and dry. No rashes noted.  Willette Cluster, NP  05/18/2017, 2:11 PM  Cc: Monica Becton

## 2017-05-19 ENCOUNTER — Encounter: Payer: Self-pay | Admitting: Nurse Practitioner

## 2017-05-20 NOTE — Progress Notes (Signed)
Reviewed and agree with documentation and assessment and plan. K. Veena Nagee Goates , MD   

## 2017-06-20 ENCOUNTER — Ambulatory Visit: Payer: PRIVATE HEALTH INSURANCE | Admitting: Nurse Practitioner

## 2017-07-06 ENCOUNTER — Ambulatory Visit: Payer: PRIVATE HEALTH INSURANCE | Admitting: Nurse Practitioner

## 2017-07-06 ENCOUNTER — Encounter: Payer: Self-pay | Admitting: Nurse Practitioner

## 2017-07-06 VITALS — BP 102/60 | HR 62 | Ht 62.0 in | Wt 182.4 lb

## 2017-07-06 DIAGNOSIS — R194 Change in bowel habit: Secondary | ICD-10-CM | POA: Diagnosis not present

## 2017-07-06 DIAGNOSIS — R1011 Right upper quadrant pain: Secondary | ICD-10-CM | POA: Diagnosis not present

## 2017-07-06 DIAGNOSIS — G8929 Other chronic pain: Secondary | ICD-10-CM | POA: Diagnosis not present

## 2017-07-06 NOTE — Progress Notes (Signed)
      IMPRESSION and PLAN:    #1.  Chronic intermittent RUQ pain, sometimes aggravated by stress but otherwise random. No associated GI symptoms.  Pain recurred after cholecystectomy.  Bowel antispasmodic, Carafate, PPI didn't help. Tried Lidoderm pathch-no improvement. -Her pain doesn't seem GI related. Neuropathic? Musculoskeletal.? Unfortunately the lidoderm patch didn't help. She has had extensive workup as described below. The only thing I can recommend at this point is an evaluation for trigger point injection. I will try and find a name of someone who does this within the [employer] system where patient is employed.      #2.  Irregular bowel habits with alternating constipation / diarrhea. Taking the metamucil. and bowels have become more regular now.       HPI:    Chief Complaint: chronic RUQ pain   Patient is a 32 yo female who I saw in late March for intermittent RUQ pain. She is an Aeronautical engineer at [employer]. She had a cholecystectomy as well as an extensive workup for the pain prior to coming her. Refer to 05/18/17 dictation for details. She had no associated GI symptoms except alternating bowel habits for which we started metamucil. Her bowels have become more regular since starting the fiber. The RUQ was aggravated by stress but no other known aggravating factors. She was given a trial of lidoderm patches and is back for follow up. Unfortunately the lidoderm patches didn't help her pain.      Review of systems:   No urinary sx. No chest pain. No SOB.     Past Medical History:  Diagnosis Date  . Ectopic pregnancy    prior pregnancy    Patient's surgical history, family medical history, social history, medications and allergies were all reviewed in Epic    Physical Exam:     BP 102/60   Pulse 62   Ht  (1.575 m)   Wt 182 lb 6.4 oz (82.7 kg)   BMI 33.36 kg/m   GENERAL:  Pleasant well developed female in NAD PSYCH: cooperative, normal affect EENT:   conjunctiva pink, mucous membranes moist, neck supple without masses CARDIAC:  RRR, no murmur heard, no peripheral edema PULM: Normal respiratory effort, lungs CTA bilaterally, no wheezing ABDOMEN:  Nondistended, soft, nontender. No obvious masses, no hepatomegaly,  normal bowel sounds SKIN:  turgor, no lesions seen Musculoskeletal:  Normal muscle tone, normal strength NEURO: Alert and oriented x 3, no focal neurologic deficits   Willette Cluster , NP 07/06/2017, 8:49 AM

## 2017-07-06 NOTE — Patient Instructions (Signed)
If you are age 32 or older, your body mass index should be between 23-30. Your Body mass index is 33.36 kg/m. If this is out of the aforementioned range listed, please consider follow up with your Primary Care Provider.  If you are age 51 or younger, your body mass index should be between 19-25. Your Body mass index is 33.36 kg/m. If this is out of the aformentioned range listed, please consider follow up with your Primary Care Provider.   Will call you about abdominal wall injection.  Thank you for choosing me and [facility].   Willette Cluster, NP

## 2017-07-11 ENCOUNTER — Encounter: Payer: Self-pay | Admitting: Nurse Practitioner

## 2017-07-11 NOTE — Progress Notes (Signed)
Reviewed and agree with documentation and assessment and plan. K. Veena Pryor Guettler , MD   

## 2018-03-10 ENCOUNTER — Ambulatory Visit (INDEPENDENT_AMBULATORY_CARE_PROVIDER_SITE_OTHER): Payer: PRIVATE HEALTH INSURANCE | Admitting: Sports Medicine

## 2018-03-10 ENCOUNTER — Encounter: Payer: Self-pay | Admitting: Sports Medicine

## 2018-03-10 ENCOUNTER — Ambulatory Visit (INDEPENDENT_AMBULATORY_CARE_PROVIDER_SITE_OTHER): Payer: PRIVATE HEALTH INSURANCE

## 2018-03-10 DIAGNOSIS — M25551 Pain in right hip: Secondary | ICD-10-CM | POA: Diagnosis not present

## 2018-03-10 DIAGNOSIS — M25561 Pain in right knee: Secondary | ICD-10-CM

## 2018-03-10 DIAGNOSIS — M25562 Pain in left knee: Secondary | ICD-10-CM

## 2018-03-10 DIAGNOSIS — G8929 Other chronic pain: Secondary | ICD-10-CM | POA: Diagnosis not present

## 2018-03-10 MED ORDER — MELOXICAM 15 MG PO TABS: ORAL_TABLET | ORAL | 3 refills | Status: AC

## 2018-03-10 NOTE — Assessment & Plan Note (Signed)
Pain is referrable to both the trochanteric bursa and the labrum. X-rays, meloxicam, rehab exercises given. Return in 2 to 4 weeks, MR arthrogram if no better. The injury occurred only 4 days ago so we do need to give her some more time.

## 2018-03-10 NOTE — Assessment & Plan Note (Signed)
Acute right knee pain, medial joint line, positive McMurray sign. X-rays, rehab exercises given, reaction knee brace. Return to see me in 2 weeks if still painful and we will proceed with an MRI.

## 2018-03-10 NOTE — Progress Notes (Signed)
Subjective:    CC: Injury  HPI: This is a pleasant 33 year old female, recently 4 days ago she fell down the steps, this started with her feeling of pain in her right hip, this caused her to lose balance, she fell, twisting her right knee as well.  She had immediate pain in her groin as well as in her medial joint line of her knee.  Only minimal swelling, no overt mechanical symptoms.  Symptoms are moderate, persistent, she is still breast-feeding.  I reviewed the past medical history, family history, social history, surgical history, and allergies today and no changes were needed.  Please see the problem list section below in epic for further details.  Past Medical History: Past Medical History:  Diagnosis Date  . Ectopic pregnancy    prior pregnancy   Past Surgical History: Past Surgical History:  Procedure Laterality Date  . APPENDECTOMY    . CHOLECYSTECTOMY  2016  . TONSILECTOMY/ADENOIDECTOMY WITH MYRINGOTOMY     Social History: Social History   Socioeconomic History  . Marital status: Married    Spouse name: Not on file  . Number of children: 3  . Years of education: Not on file  . Highest education level: Not on file  Occupational History  . Not on file  Social Needs  . Financial resource strain: Not on file  . Food insecurity:    Worry: Not on file    Inability: Not on file  . Transportation needs:    Medical: Not on file    Non-medical: Not on file  Tobacco Use  . Smoking status: Never Smoker  . Smokeless tobacco: Never Used  Substance and Sexual Activity  . Alcohol use: No  . Drug use: No  . Sexual activity: Yes    Partners: Male    Birth control/protection: I.U.D.  Lifestyle  . Physical activity:    Days per week: Not on file    Minutes per session: Not on file  . Stress: Not on file  Relationships  . Social connections:    Talks on phone: Not on file    Gets together: Not on file    Attends religious service: Not on file    Active member of  club or organization: Not on file    Attends meetings of clubs or organizations: Not on file    Relationship status: Not on file  Other Topics Concern  . Not on file  Social History Narrative  . Not on file   Family History: Family History  Problem Relation Age of Onset  . Diabetes Father   . Hypothyroidism Father   . Heart disease Father   . Hypothyroidism Mother   . Colon cancer Paternal Grandmother   . Colon cancer Paternal Grandfather   . Ovarian cancer Maternal Aunt    Allergies: No Known Allergies Medications: See med rec.  Review of Systems: No fevers, chills, night sweats, weight loss, chest pain, or shortness of breath.   Objective:    General: Well Developed, well nourished, and in no acute distress.  Neuro: Alert and oriented x3, extra-ocular muscles intact, sensation grossly intact.  HEENT: Normocephalic, atraumatic, pupils equal round reactive to light, neck supple, no masses, no lymphadenopathy, thyroid nonpalpable.  Skin: Warm and dry, no rashes. Cardiac: Regular rate and rhythm, no murmurs rubs or gallops, no lower extremity edema.  Respiratory: Clear to auscultation bilaterally. Not using accessory muscles, speaking in full sentences. Right hip: ROM IR: 60 Deg with mild reproduction of pain, ER: 60  Deg, Flexion: 120 Deg, Extension: 100 Deg, Abduction: 45 Deg, Adduction: 45 Deg Strength IR: 5/5, ER: 5/5, Flexion: 5/5, Extension: 5/5, Abduction: 5/5, Adduction: 5/5 Pelvic alignment unremarkable to inspection and palpation. Standing hip rotation and gait without trendelenburg / unsteadiness. Greater trochanter with mild tenderness to palpation. No tenderness over piriformis. No SI joint tenderness and normal minimal SI movement. Right knee: Minimally swollen, mild fluid wave, tender to palpation medial joint line. ROM normal in flexion and extension and lower leg rotation. Ligaments with solid consistent endpoints including ACL, PCL, LCL, MCL. Positive  McMurray's test with a palpable pop. Non painful patellar compression. Patellar and quadriceps tendons unremarkable. Hamstring and quadriceps strength is normal.  Impression and Recommendations:    Knee pain, right Acute right knee pain, medial joint line, positive McMurray sign. X-rays, rehab exercises given, reaction knee brace. Return to see me in 2 weeks if still painful and we will proceed with an MRI.  Acute right hip pain Pain is referrable to both the trochanteric bursa and the labrum. X-rays, meloxicam, rehab exercises given. Return in 2 to 4 weeks, MR arthrogram if no better. The injury occurred only 4 days ago so we do need to give her some more time. ___________________________________________ Ihor Austin. Benjamin Stain, M.D., ABFM., CAQSM. Primary Care and Sports Medicine [facility]  Adjunct Professor of Family Medicine  [employer]

## 2018-03-24 ENCOUNTER — Encounter: Payer: Self-pay | Admitting: Sports Medicine

## 2018-03-24 ENCOUNTER — Ambulatory Visit (INDEPENDENT_AMBULATORY_CARE_PROVIDER_SITE_OTHER): Payer: PRIVATE HEALTH INSURANCE | Admitting: Sports Medicine

## 2018-03-24 DIAGNOSIS — M25551 Pain in right hip: Secondary | ICD-10-CM | POA: Diagnosis not present

## 2018-03-24 DIAGNOSIS — M25561 Pain in right knee: Secondary | ICD-10-CM

## 2018-03-24 DIAGNOSIS — M533 Sacrococcygeal disorders, not elsewhere classified: Secondary | ICD-10-CM | POA: Diagnosis not present

## 2018-03-24 MED ORDER — TRAMADOL HCL 50 MG PO TABS: 50.0000 mg | ORAL_TABLET | Freq: Three times a day (TID) | ORAL | 0 refills | Status: AC | PRN

## 2018-03-24 MED ORDER — TRAMADOL HCL 50 MG PO TABS: 50.0000 mg | ORAL_TABLET | Freq: Three times a day (TID) | ORAL | 0 refills | Status: DC | PRN

## 2018-03-24 NOTE — Assessment & Plan Note (Signed)
Pain referrable to the labrum and trochanteric bursa. Failed conservative measures. I do have a concern for hip labral tear. Return for MR arthrogram.

## 2018-03-24 NOTE — Assessment & Plan Note (Signed)
Right knee pain persistent in spite of conservative measures. Mechanical symptoms, popping and locking. Concern for meniscal tear. Proceeding with MRI.

## 2018-03-24 NOTE — Assessment & Plan Note (Signed)
SI joint injection provided good relief in the past back in 2018. Continued discomfort, radiation around to the right flank and groin. Worse with sitting in flexion. Proceeding with lumbar spine MRI. Adding tramadol for breakthrough pain. She is done breast-feeding.

## 2018-03-24 NOTE — Progress Notes (Signed)
Subjective:    CC: Follow-up  HPI: Right knee pain: Persistent, mechanical symptoms.  Back pain: Right hip, groin, lateral hip.  Moderate, persistent, significant mechanical symptoms,  I reviewed the past medical history, family history, social history, surgical history, and allergies today and no changes were needed.  Please see the problem list section below in epic for further details.  Past Medical History: Past Medical History:  Diagnosis Date  . Ectopic pregnancy    prior pregnancy   Past Surgical History: Past Surgical History:  Procedure Laterality Date  . APPENDECTOMY    . CHOLECYSTECTOMY  2016  . TONSILECTOMY/ADENOIDECTOMY WITH MYRINGOTOMY     Social History: Social History   Socioeconomic History  . Marital status: Married    Spouse name: Not on file  . Number of children: 3  . Years of education: Not on file  . Highest education level: Not on file  Occupational History  . Not on file  Social Needs  . Financial resource strain: Not on file  . Food insecurity:    Worry: Not on file    Inability: Not on file  . Transportation needs:    Medical: Not on file    Non-medical: Not on file  Tobacco Use  . Smoking status: Never Smoker  . Smokeless tobacco: Never Used  Substance and Sexual Activity  . Alcohol use: No  . Drug use: No  . Sexual activity: Yes    Partners: Male    Birth control/protection: I.U.D.  Lifestyle  . Physical activity:    Days per week: Not on file    Minutes per session: Not on file  . Stress: Not on file  Relationships  . Social connections:    Talks on phone: Not on file    Gets together: Not on file    Attends religious service: Not on file    Active member of club or organization: Not on file    Attends meetings of clubs or organizations: Not on file    Relationship status: Not on file  Other Topics Concern  . Not on file  Social History Narrative  . Not on file   Family History: Family History  Problem Relation  Age of Onset  . Diabetes Father   . Hypothyroidism Father   . Heart disease Father   . Hypothyroidism Mother   . Colon cancer Paternal Grandmother   . Colon cancer Paternal Grandfather   . Ovarian cancer Maternal Aunt    Allergies: No Known Allergies Medications: See med rec.  Review of Systems: No fevers, chills, night sweats, weight loss, chest pain, or shortness of breath.   Objective:    General: Well Developed, well nourished, and in no acute distress.  Neuro: Alert and oriented x3, extra-ocular muscles intact, sensation grossly intact.  HEENT: Normocephalic, atraumatic, pupils equal round reactive to light, neck supple, no masses, no lymphadenopathy, thyroid nonpalpable.  Skin: Warm and dry, no rashes. Cardiac: Regular rate and rhythm, no murmurs rubs or gallops, no lower extremity edema.  Respiratory: Clear to auscultation bilaterally. Not using accessory muscles, speaking in full sentences. Right knee: Normal to inspection with no erythema or effusion or obvious bony abnormalities. Palpation normal with no warmth or joint line tenderness or patellar tenderness or condyle tenderness. ROM normal in flexion and extension and lower leg rotation. Ligaments with solid consistent endpoints including ACL, PCL, LCL, MCL. Positive Mcmurray's and provocative meniscal tests. Non painful patellar compression. Patellar and quadriceps tendons unremarkable. Hamstring and quadriceps strength is  normal. Right hip: ROM IR: 60 Deg with pain, ER: 60 Deg, Flexion: 120 Deg, Extension: 100 Deg, Abduction: 45 Deg, Adduction: 45 Deg Strength IR: 5/5, ER: 5/5, Flexion: 5/5, Extension: 5/5, Abduction: 5/5, Adduction: 5/5 Pelvic alignment unremarkable to inspection and palpation. Standing hip rotation and gait without trendelenburg / unsteadiness. Greater trochanter without tenderness to palpation. No tenderness over piriformis. No SI joint tenderness and normal minimal SI movement. Positive  FADIR sign.  Impression and Recommendations:    Acute right hip pain Pain referrable to the labrum and trochanteric bursa. Failed conservative measures. I do have a concern for hip labral tear. Return for MR arthrogram.  Knee pain, right Right knee pain persistent in spite of conservative measures. Mechanical symptoms, popping and locking. Concern for meniscal tear. Proceeding with MRI.  Sacroiliac joint dysfunction of left side SI joint injection provided good relief in the past back in 2018. Continued discomfort, radiation around to the right flank and groin. Worse with sitting in flexion. Proceeding with lumbar spine MRI. Adding tramadol for breakthrough pain. She is done breast-feeding. ___________________________________________ Ihor Austin. Benjamin Stain, M.D., ABFM., CAQSM. Primary Care and Sports Medicine  [facility]  Adjunct Professor of Family Medicine  [employer]

## 2018-04-10 ENCOUNTER — Ambulatory Visit (INDEPENDENT_AMBULATORY_CARE_PROVIDER_SITE_OTHER): Payer: PRIVATE HEALTH INSURANCE | Admitting: Sports Medicine

## 2018-04-10 ENCOUNTER — Ambulatory Visit (INDEPENDENT_AMBULATORY_CARE_PROVIDER_SITE_OTHER): Payer: PRIVATE HEALTH INSURANCE

## 2018-04-10 DIAGNOSIS — M25561 Pain in right knee: Secondary | ICD-10-CM | POA: Diagnosis not present

## 2018-04-10 DIAGNOSIS — M25551 Pain in right hip: Secondary | ICD-10-CM

## 2018-04-10 DIAGNOSIS — M533 Sacrococcygeal disorders, not elsewhere classified: Secondary | ICD-10-CM

## 2018-04-10 DIAGNOSIS — M5136 Other intervertebral disc degeneration, lumbar region: Secondary | ICD-10-CM | POA: Diagnosis not present

## 2018-04-10 MED ORDER — GADOBUTROL 1 MMOL/ML IV SOLN
1.0000 mL | Freq: Once | INTRAVENOUS | Status: AC
  Administered 2018-04-10: 1 mL

## 2018-04-10 NOTE — Assessment & Plan Note (Signed)
Pain referrable to both the labrum and trochanteric bursa, failed conservative measures. Concern for hip labral tear, arthrogram injection today, results to follow.

## 2018-04-10 NOTE — Progress Notes (Signed)
   Procedure: Real-time Ultrasound Guided gadolinium contrast injection of right hip joint Device: GE Logiq E  Verbal informed consent obtained.  Time-out conducted.  Noted no overlying erythema, induration, or other signs of local infection.  Skin prepped in a sterile fashion.  Local anesthesia: Topical Ethyl chloride.  With sterile technique and under real time ultrasound guidance: 22-gauge spinal needle advanced to the femoral head/neck junction, contacted bone and injected 1 cc Kenalog 40, 2 cc lidocaine, 2 cc bupivacaine.  Syringe switched and 0.1 cc gadolinium injected, syringe again switched and 12 cc sterile saline used to flush the needle and distend the joint. Joint visualized and capsule seen distending confirming intra-articular placement of contrast material and medication. Completed without difficulty  Advised to call if fevers/chills, erythema, induration, drainage, or persistent bleeding.  Images permanently stored and available for review in the ultrasound unit.  Impression: Technically successful ultrasound guided gadolinium contrast injection for MR arthrography.  Please see separate MR arthrogram report.

## 2018-04-11 ENCOUNTER — Telehealth: Payer: Self-pay

## 2018-04-11 MED ORDER — EPINEPHRINE PF 1 MG/ML IJ SOLN
.20 | INTRAMUSCULAR | Status: DC
Start: ? — End: 2018-04-11

## 2018-04-11 NOTE — Telephone Encounter (Signed)
Pt called today during lunch and spoke with call team stating that "had MRI done with contrast in right hip. Having body aches, flushes-red face and cheeks." Pt also reported having rash on chest and face with red bumps. Pt was advised by on call nurse to go to ED.   Called pt for update on situation/see if she was seen.. no answer so I left pt a VM to call back.   FYI to  provider

## 2018-04-11 NOTE — Telephone Encounter (Signed)
Ahhh....ED?!?!?  I had plenty of openings today.  Some people have odd reactions, nothing sounds life threatening.

## 2018-04-12 NOTE — Telephone Encounter (Signed)
Pt left vm today giving an update.  She works for [employer] so she just went downstairs to the ED there.  She is on a Prednisone burst for 5 days, as well as Pepcid.

## 2018-04-13 ENCOUNTER — Ambulatory Visit (INDEPENDENT_AMBULATORY_CARE_PROVIDER_SITE_OTHER): Payer: PRIVATE HEALTH INSURANCE | Admitting: Sports Medicine

## 2018-04-13 ENCOUNTER — Encounter: Payer: Self-pay | Admitting: Sports Medicine

## 2018-04-13 DIAGNOSIS — M25551 Pain in right hip: Secondary | ICD-10-CM

## 2018-04-13 MED ORDER — DULOXETINE HCL 30 MG PO CPEP: 30.0000 mg | ORAL_CAPSULE | Freq: Every day | ORAL | 3 refills | Status: AC

## 2018-04-13 NOTE — Patient Instructions (Signed)
Still somewhat cloudy etiology, lumbar spine MRI showed mild disc desiccation but no central or foraminal stenosis. Hip MRI with intra-articular contrast was negative for DJD or labral tears. SI joint on the right side was unremarkable. We did have a long discussion of the lower sensitivity of MRI for finding myofascial pathology. I did discuss with her that it was reassuring that she had no severe structural abnormalities and that ultimately the treatment was to try and block the symptoms, and for her to remain active. I am going to add Cymbalta, we discussed Lyrica and gabapentin as well. Return in 1 month.  Interestingly she did report some flushing, itching, and possibly some stridor and difficulty swallowing 1 day after her intra-articular 0.1 cc gadolinium injection.  I did advise her that this was likely from the steroid used in the injection and less likely from an allergic reaction.  If persistence of discomfort we could also consider an intra-abdominal etiology.  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

## 2018-04-13 NOTE — Assessment & Plan Note (Addendum)
Unclear etiology, lumbar spine MRI showed mild disc desiccation but no central or foraminal stenosis. Hip MRI with intra-articular contrast was negative for DJD or labral tears. SI joint on the right side was for the most part unremarkable. We did have a long discussion of the lower sensitivity of MRI for finding myofascial pathology. I did discuss with her that it was reassuring that she had no severe structural abnormalities and that ultimately the treatment was to try and block the symptoms, and for her to remain active. I am going to add Cymbalta, we discussed Lyrica and gabapentin as well. Return in 1 month.  Interestingly she did report some flushing, itching, and possibly some stridor and difficulty swallowing 1 day after her intra-articular 0.1 cc gadolinium injection.  I did advise her that this was likely from the steroid used in the injection and less likely from an allergic reaction.  If persistence of discomfort we could also consider an intra-abdominal etiology.  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

## 2018-04-13 NOTE — Progress Notes (Signed)
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  Failed conservative measures so we obtained MRIs of her lumbar spine, hip.  MRI of her lumbar spine showed mild DDD, hip MRI was completely normal.  She does have a history of peritoneal adhesions with resolution of symptoms with lysis of adhesions.  She did have some facial flushing throat tightness, and chest rash after the steroid injection with gadolinium for her arthrogram.  Symptoms started 1 day after the injection.  They all resolved quickly.  I reviewed the past medical history, family history, social history, surgical history, and allergies today and no changes were needed.  Please see the problem list section below in epic for further details.  Past Medical History: Past Medical History:  Diagnosis Date  . Ectopic pregnancy    prior pregnancy   Past Surgical History: Past Surgical History:  Procedure Laterality Date  . APPENDECTOMY    . CHOLECYSTECTOMY  2016  . TONSILECTOMY/ADENOIDECTOMY WITH MYRINGOTOMY     Social History: Social History   Socioeconomic History  . Marital status: Married    Spouse name: Not on file  . Number of children: 3  . Years of education: Not on file  . Highest education level: Not on file  Occupational History  . Not on file  Social Needs  . Financial resource strain: Not on file  . Food insecurity:    Worry: Not on file    Inability: Not on file  . Transportation needs:    Medical: Not on file    Non-medical: Not on file  Tobacco Use  . Smoking status: Never Smoker  . Smokeless tobacco: Never Used  Substance and Sexual Activity  . Alcohol use: No  . Drug use: No  . Sexual activity: Yes    Partners: Male    Birth control/protection: I.U.D.  Lifestyle  . Physical activity:    Days per week: Not on file    Minutes per session: Not on file  .  Stress: Not on file  Relationships  . Social connections:    Talks on phone: Not on file    Gets together: Not on file    Attends religious service: Not on file    Active member of club or organization: Not on file    Attends meetings of clubs or organizations: Not on file    Relationship status: Not on file  Other Topics Concern  . Not on file  Social History Narrative  . Not on file   Family History: Family History  Problem Relation Age of Onset  . Diabetes Father   . Hypothyroidism Father   . Heart disease Father   . Hypothyroidism Mother   . Colon cancer Paternal Grandmother   . Colon cancer Paternal Grandfather   . Ovarian cancer Maternal Aunt    Allergies: No Known Allergies Medications: See med rec.  Review of Systems: No fevers, chills, night sweats, weight loss, chest pain, or shortness of breath.   Objective:    General: Well Developed, well nourished, and in no acute distress.  Neuro: Alert and oriented x3, extra-ocular muscles intact, sensation grossly intact.  HEENT: Normocephalic, atraumatic, pupils equal round reactive to light, neck supple, no masses, no lymphadenopathy, thyroid nonpalpable.  Skin: Warm and dry, no rashes. Cardiac: Regular rate and rhythm, no murmurs rubs or gallops, no lower extremity edema.  Respiratory: Clear to auscultation bilaterally. Not using accessory muscles, speaking in full sentences.  Impression and Recommendations:    Acute right hip pain Unclear etiology, lumbar spine MRI showed mild disc desiccation but no central or foraminal stenosis. Hip MRI with intra-articular contrast was negative for DJD or labral tears. SI joint on the right side was for the most part unremarkable. We did have a long discussion of the lower sensitivity of MRI for finding myofascial pathology. I did discuss with her that it was reassuring that she had no severe structural abnormalities and that ultimately the treatment was to try and block the  symptoms, and for her to remain active. I am going to add Cymbalta, we discussed Lyrica and gabapentin as well. Return in 1 month.  Interestingly she did report some flushing, itching, and possibly some stridor and difficulty swallowing 1 day after her intra-articular 0.1 cc gadolinium injection.  I did advise her that this was likely from the steroid used in the injection and less likely from an allergic reaction.  If persistence of discomfort we could also consider an intra-abdominal etiology.  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  I spent 25 minutes with this patient, greater than 50% was face-to-face time counseling regarding the above diagnoses, specifically possible diagnosis and treatment options as well as the prognosis ___________________________________________ Ihor Austin. Benjamin Stain, M.D., ABFM., CAQSM. Primary Care and Sports Medicine [facility]  Adjunct Professor of Family Medicine  [employer]

## 2019-02-17 IMAGING — MR MR KNEE*R* W/O CM
7 series · 40 of 40 positions shown · non-contrast
Comparison: Bilateral knee x-rays dated March 10, 2018.

CLINICAL DATA: Persistent medial right knee pain since falling down
the stairs 3 weeks ago.

EXAM:
MRI OF THE RIGHT KNEE WITHOUT CONTRAST
TECHNIQUE: Multiplanar, multisequence MR imaging of the knee was performed. No
intravenous contrast was administered.

[Series 3: T2 fat-sat · axial · 4.0mm · 0.53mm/px · z∈[-75,+95]mm · 6 of 35 slices shown (1 of 3)]
[im 1/35]
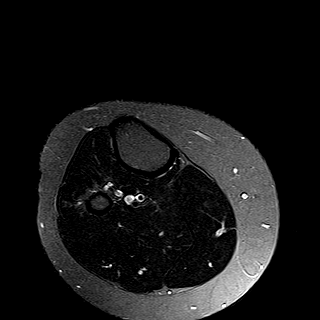
[im 7/35]
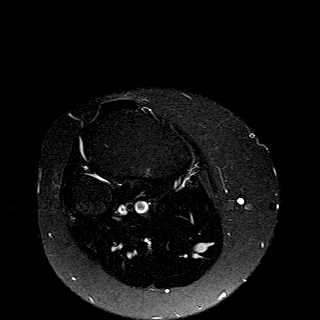
[im 14/35]
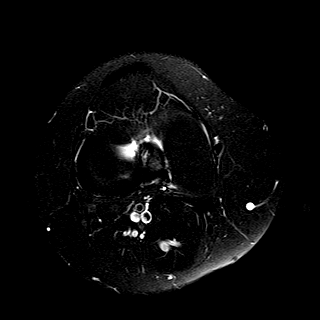
[im 21/35]
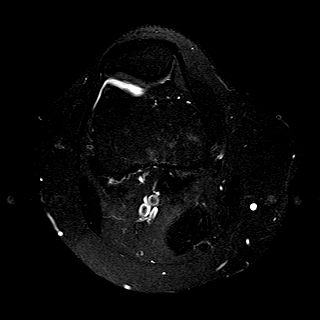
[im 28/35]
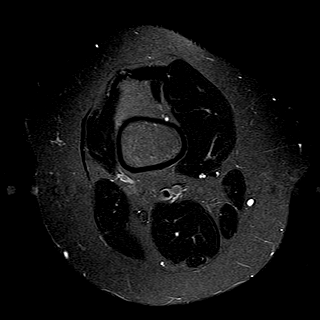
[im 35/35]
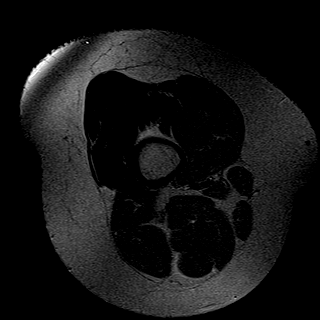

[Series 4: T1 · coronal · 4.0mm · 0.62mm/px · 6 of 31 slices shown]
[im 1/31]
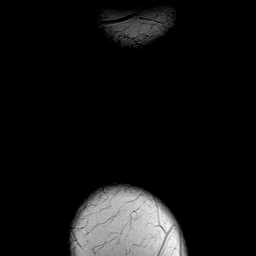
[im 7/31]
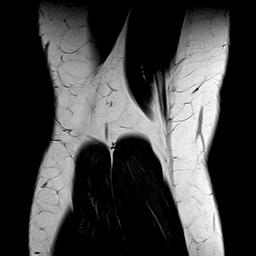
[im 13/31]
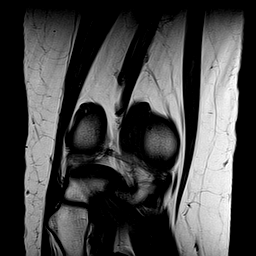
[im 19/31]
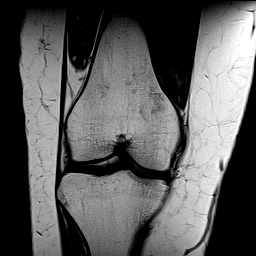
[im 25/31]
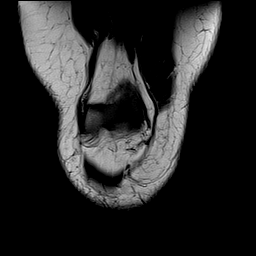
[im 31/31]
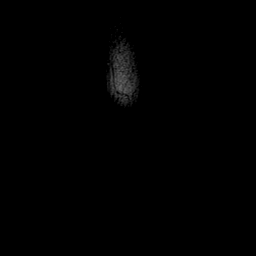

[Series 5: PD fat-sat · sagittal · 3.0mm · 0.62mm/px · 6 of 35 slices shown (1 of 3)]
[im 1/35]
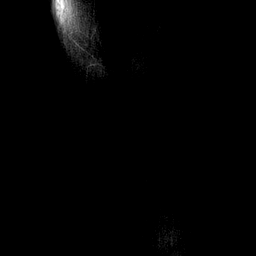
[im 7/35]
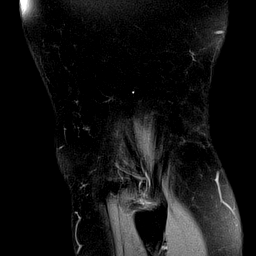
[im 14/35]
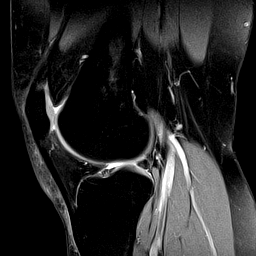
[im 21/35]
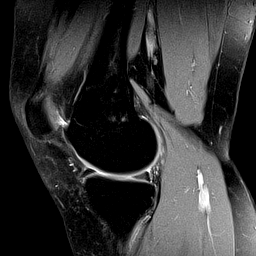
[im 28/35]
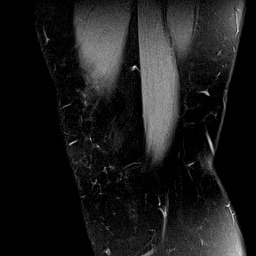
[im 35/35]
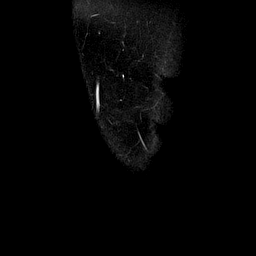

[Series 6: T2 fat-sat · coronal · 4.0mm · 0.62mm/px · 6 of 31 slices shown (2 of 3)]
[im 1/31]
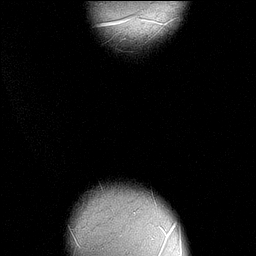
[im 7/31]
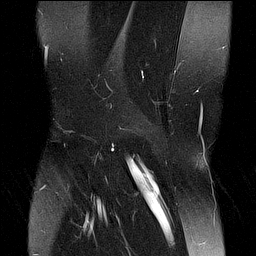
[im 13/31]
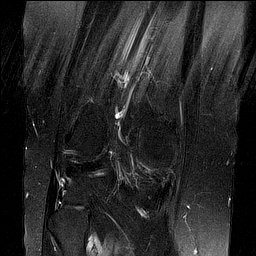
[im 19/31]
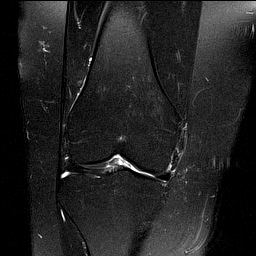
[im 25/31]
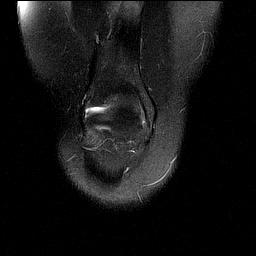
[im 31/31]
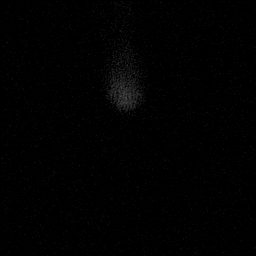

[Series 7: PD fat-sat · coronal · 4.0mm · 0.62mm/px · 6 of 31 slices shown (2 of 3)]
[im 1/31]
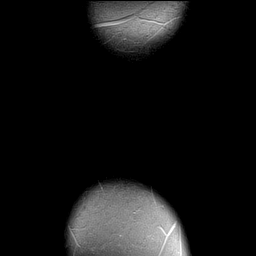
[im 7/31]
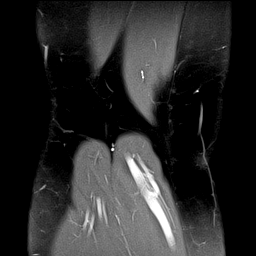
[im 13/31]
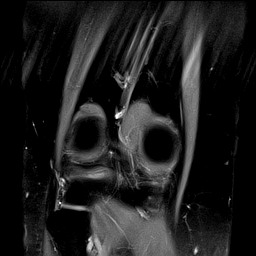
[im 19/31]
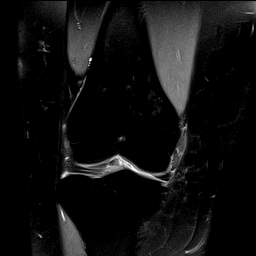
[im 25/31]
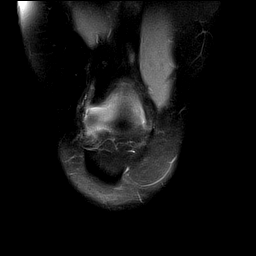
[im 31/31]
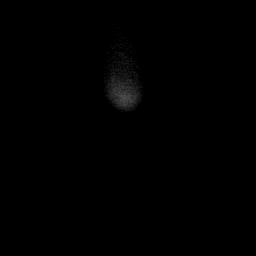

[Series 8: T2 fat-sat · sagittal · 3.0mm · 0.62mm/px · 6 of 35 slices shown (3 of 3)]
[im 1/35]
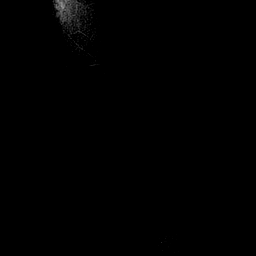
[im 7/35]
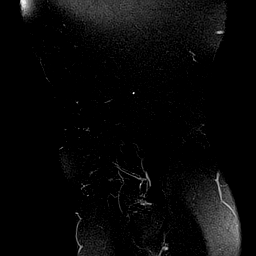
[im 14/35]
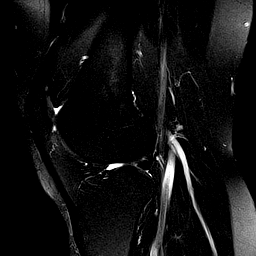
[im 21/35]
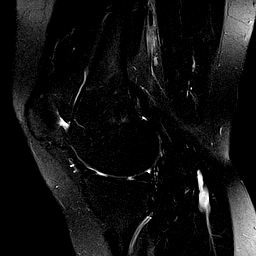
[im 28/35]
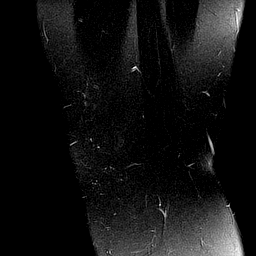
[im 35/35]
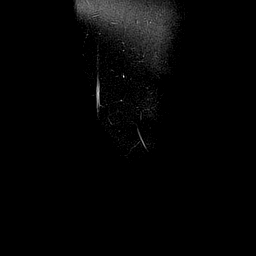

[Series 9: PD fat-sat · oblique · 2.0mm · 0.62mm/px · 4 of 19 slices shown (3 of 3)]
[im 1/19]
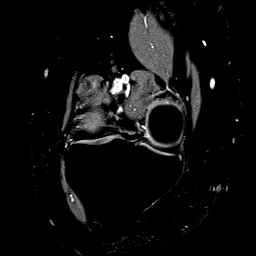
[im 7/19]
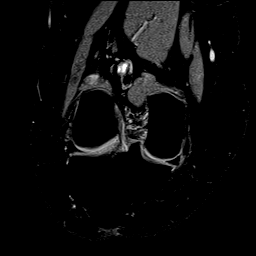
[im 13/19]
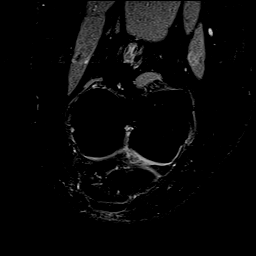
[im 19/19]
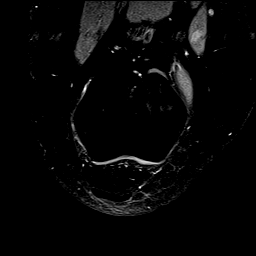

[40 of 40 positions shown; findings below may reference images not displayed]

FINDINGS: MENISCI

Medial meniscus:  Intact.

Lateral meniscus:  Intact.

LIGAMENTS

Cruciates:  Intact ACL and PCL.

Collaterals: Medial collateral ligament is intact. Lateral
collateral ligament complex is intact.

CARTILAGE

Patellofemoral:  Normal.

Medial:  Normal.

Lateral:  Normal.

Joint:  No joint effusion. Normal Hoffa's fat. No plical thickening.

Popliteal Fossa:  No Baker cyst. Intact popliteus tendon.

Extensor Mechanism: Intact quadriceps tendon and patellar tendon.
Intact medial and lateral patellar retinaculum. Intact MPFL.

Bones: No focal marrow signal abnormality. No fracture or
dislocation.

Other: None.
IMPRESSION: 1. No evidence of internal derangement.
